# Patient Record
Sex: Female | Born: 1980 | Race: White | Hispanic: No | Marital: Married | Smoking: Never smoker
Health system: Southern US, Community
[De-identification: ages and names within clinical notes are randomized; demographics above are authoritative.]

## PROBLEM LIST (undated history)

## (undated) DIAGNOSIS — I10 Essential (primary) hypertension: Secondary | ICD-10-CM

## (undated) DIAGNOSIS — F419 Anxiety disorder, unspecified: Secondary | ICD-10-CM

## (undated) DIAGNOSIS — J302 Other seasonal allergic rhinitis: Secondary | ICD-10-CM

## (undated) DIAGNOSIS — N83201 Unspecified ovarian cyst, right side: Secondary | ICD-10-CM

## (undated) HISTORY — PX: NO PAST SURGERIES: SHX2092

---

## 2011-06-07 ENCOUNTER — Ambulatory Visit: Payer: Self-pay

## 2014-10-02 ENCOUNTER — Ambulatory Visit
Admission: EM | Admit: 2014-10-02 | Discharge: 2014-10-02 | Disposition: A | Discharge status: Home / Self Care | Payer: BLUE CROSS/BLUE SHIELD | Attending: Family Medicine | Admitting: Family Medicine

## 2014-10-02 DIAGNOSIS — Z3490 Encounter for supervision of normal pregnancy, unspecified, unspecified trimester: Secondary | ICD-10-CM

## 2014-10-02 DIAGNOSIS — B349 Viral infection, unspecified: Secondary | ICD-10-CM

## 2014-10-02 NOTE — ED Notes (Signed)
Started yesterday with cough. Progressed now to chills and bodyaches with cough. LMP 04/01/14 with [facility] 01/08/15

## 2014-10-06 ENCOUNTER — Encounter: Payer: Self-pay | Admitting: Physician Assistant

## 2014-10-06 NOTE — ED Provider Notes (Signed)
CSN: [id number]     Arrival date & time 10/02/14  1459 History   First MD Initiated Contact with Patient 10/02/14 1536     Chief Complaint  Patient presents with  . URI   (Consider location/radiation/quality/duration/timing/severity/associated sxs/prior Treatment) HPI34 yo F and husband present- has had 24 hours of mildly aching long bones, fatigue, non-productive cough.Afebrile. Currently 6 months pregnant. Has seasonal allergies -on Zyrtec. Reports she had her flu shot. Reports she has had a increased heart rate during pregnancy. Couple concerned because she napped a lot yesterday and is tired again today. She is scheduled to see OB GYN in the morning for routine. Baby has been active.  History reviewed. No pertinent past medical history. History reviewed. No pertinent past surgical history. History reviewed. No pertinent family history. History  Substance Use Topics  . Smoking status: Former Games developer  . Smokeless tobacco: Not on file  . Alcohol Use: No   OB History    Gravida Para Term Preterm AB TAB SAB Ectopic Multiple Living   3 1             Review of Systems  Constitutional: no fever. Mild fatigue Eyes: No visual changes. No red eyes/discharge. ENT:No sore throat or ear pain Cardiovascular:Negative for chest pain/palpitations, tachy 115 Respiratory: Negative for shortness of breath Gastrointestinal: No abdominal pain. No nausea,vomiting.No Diarrhea Genitourinary: Negative for dysuria.Normal urination. Musculoskeletal: Negative for back pain. FROM extremities without pain Skin: Negative for rash Neurological: Negative for headache, focal weakness or numbness   Allergies  Review of patient's allergies indicates no known allergies.  Home Medications   Prior to Admission medications   Medication Sig Start Date End Date Taking? Authorizing Provider  cetirizine (ZYRTEC) 10 MG tablet Take 10 mg by mouth daily.   Yes Historical Provider, MD  Prenatal Vit-Fe Fumarate-FA  (PRENATAL MULTIVITAMIN) TABS tablet Take 1 tablet by mouth daily at 12 noon.   Yes Historical Provider, MD   BP 123/70 mmHg  Pulse 115  Temp(Src) 98.7 F (37.1 C) (Oral)  Resp 17  Ht 5\' 8"  (1.727 m)  Wt 178 lb (80.74 kg)  BMI 27.07 kg/m2  SpO2 100%  LMP 04/01/2014 (Approximate) Physical Exam Constitutional -alert and oriented,well appearing and in no acute distress Head-atraumatic Eyes- conjunctiva normal, EOMI ,conjugate gaze Ears- canals and TM neg bilat Nose- no congestion or rhinorrhea Mouth/throat- mucous membranes moist ,oropharynx non-erythematous Neck- supple without glandular enlargement CV- tachy 115, grossly normal heart sounds, good peripheral circulation Resp-no distress, normal respiratory effort,clear to auscultation bilaterally-no cough witnessed, R 17 GI- soft,non-tender, pregnant -denies contractions, baby active GU- not examined MSK- no lower extremity tenderness nor edema,no joint effusion, ambulatory Neuro- normal speech and language,  Skin-warm,dry ,intact; no rash noted Psych-mood and affect grossly normal; speech and behavior grossly normal ED Course  Procedures (including critical care time) Labs Review Labs Reviewed - No data to display  Imaging Review No results found.  Exam is significant only for the increased HR. She exhibited no respiratory distress. Has not been particularly hungry -but is encouraged to monitor her fluid intake to be adequate for voiding every few hours. No evidence of infection and no coughing while present. We discussed viral syndrome and symptomatic interventions with tylenol if needed. Defer cold preparations and  other medication with deference to pregnancy. Rest in lateral position and increase fluids. See OB in AM  MDM   1. Viral syndrome   2. Pregnancy        Rae Halsted, PA-C  10/06/14 0707 

## 2014-11-24 ENCOUNTER — Encounter: Payer: Self-pay | Admitting: Emergency Medicine

## 2014-11-24 ENCOUNTER — Ambulatory Visit
Admission: EM | Admit: 2014-11-24 | Discharge: 2014-11-24 | Disposition: A | Discharge status: Home / Self Care | Payer: BLUE CROSS/BLUE SHIELD | Attending: Internal Medicine | Admitting: Internal Medicine

## 2014-11-24 DIAGNOSIS — Z331 Pregnant state, incidental: Secondary | ICD-10-CM

## 2014-11-24 DIAGNOSIS — W57XXXA Bitten or stung by nonvenomous insect and other nonvenomous arthropods, initial encounter: Secondary | ICD-10-CM | POA: Diagnosis not present

## 2014-11-24 DIAGNOSIS — S30861A Insect bite (nonvenomous) of abdominal wall, initial encounter: Secondary | ICD-10-CM

## 2014-11-24 DIAGNOSIS — Z3A33 33 weeks gestation of pregnancy: Secondary | ICD-10-CM

## 2014-11-24 NOTE — Discharge Instructions (Signed)
At this time, no treatment for the tick bites is indicated. Followup with [facility] if rash or fever >100.5 develop in the next 2-30 days, to discuss further evaluation/treatment.  Tick Bite Information Ticks are insects that attach themselves to the skin and draw blood for food. There are various types of ticks. Common types include wood ticks and deer ticks. Most ticks live in shrubs and grassy areas. Ticks can climb onto your body when you make contact with leaves or grass where the tick is waiting. The most common places on the body for ticks to attach themselves are the scalp, neck, armpits, waist, and groin. Most tick bites are harmless, but sometimes ticks carry germs that cause diseases. These germs can be spread to a person during the tick's feeding process. The chance of a disease spreading through a tick bite depends on:   The type of tick.  Time of year.   How long the tick is attached.   Geographic location.  HOW CAN YOU PREVENT TICK BITES? Take these steps to help prevent tick bites when you are outdoors:  Wear protective clothing. Long sleeves and long pants are best.   Wear white clothes so you can see ticks more easily.  Tuck your pant legs into your socks.   If walking on a trail, stay in the middle of the trail to avoid brushing against bushes.  Avoid walking through areas with long grass.  Put insect repellent on all exposed skin and along boot tops, pant legs, and sleeve cuffs.   Check clothing, hair, and skin repeatedly and before going inside.   Brush off any ticks that are not attached.  Take a shower or bath as soon as possible after being outdoors.  WHAT IS THE PROPER WAY TO REMOVE A TICK? Ticks should be removed as soon as possible to help prevent diseases caused by tick bites. 1. If latex gloves are available, put them on before trying to remove a tick.  2. Using fine-point tweezers, grasp the tick as close to the skin as  possible. You may also use curved forceps or a tick removal tool. Grasp the tick as close to its head as possible. Avoid grasping the tick on its body. 3. Pull gently with steady upward pressure until the tick lets go. Do not twist the tick or jerk it suddenly. This may break off the tick's head or mouth parts. 4. Do not squeeze or crush the tick's body. This could force disease-carrying fluids from the tick into your body.  5. After the tick is removed, wash the bite area and your hands with soap and water or other disinfectant such as alcohol. 6. Apply a small amount of antiseptic cream or ointment to the bite site.  7. Wash and disinfect any instruments that were used.  Do not try to remove a tick by applying a hot match, petroleum jelly, or fingernail polish to the tick. These methods do not work and may increase the chances of disease being spread from the tick bite.  WHEN SHOULD YOU SEEK MEDICAL CARE? Contact your health care provider if you are unable to remove a tick from your skin or if a part of the tick breaks off and is stuck in the skin.  After a tick bite, you need to be aware of signs and symptoms that could be related to diseases spread by ticks. Contact your health care provider if you develop any of the following in the days or weeks  after the tick bite:  Unexplained fever.  Rash. A circular rash that appears days or weeks after the tick bite may indicate the possibility of Lyme disease. The rash may resemble a target with a bull's-eye and may occur at a different part of your body than the tick bite.  Redness and swelling in the area of the tick bite.   Tender, swollen lymph glands.   Diarrhea.   Weight loss.   Cough.   Fatigue.   Muscle, joint, or bone pain.   Abdominal pain.   Headache.   Lethargy or a change in your level of consciousness.  Difficulty walking or moving your legs.   Numbness in the legs.   Paralysis.  Shortness of breath.    Confusion.   Repeated vomiting.  Document Released: 03/29/2000 Document Revised: 01/20/2013 Document Reviewed: 09/09/2012 [facility] Patient Information 2015 [place], [place]. This information is not intended to replace advice given to you by your health care provider. Make sure you discuss any questions you have with your health care provider.

## 2014-11-24 NOTE — ED Notes (Signed)
Ticks bites today 33weeks pregant

## 2014-11-24 NOTE — ED Provider Notes (Signed)
CSN: [id number]     Arrival date & time 11/24/14  1208 History   First MD Initiated Contact with Patient 11/24/14 1233     Chief Complaint  Patient presents with  . Insect Bite   HPI  Patient is a 34 year old lady who is [redacted] weeks pregnant. She presents today after discovering numerous, numerous tiny ticks on her abdomen, some embedded. She has many itchy red spots on her abdomen and lower torso. There were no ticks on her abdomen yesterday morning. No fever, no rash (other than the tick bite sites). No GI symptoms. No malaise.  History reviewed. No pertinent past medical history. History reviewed. No pertinent past surgical history.  Social History  Substance Use Topics  . Smoking status: Never Smoker   . Smokeless tobacco: None  . Alcohol Use: None   OB History    Gravida Para Term Preterm AB TAB SAB Ectopic Multiple Living   3 1             Review of Systems  All other systems reviewed and are negative.   Allergies  Review of patient's allergies indicates no known allergies.  Home Medications   Prior to Admission medications   Medication Sig Start Date End Date Taking? Authorizing Provider  cetirizine (ZYRTEC) 10 MG tablet Take 10 mg by mouth daily.    Historical Provider, MD  Prenatal Vit-Fe Fumarate-FA (PRENATAL MULTIVITAMIN) TABS tablet Take 1 tablet by mouth daily at 12 noon.    Historical Provider, MD   BP 104/62 mmHg  Pulse 91  Temp(Src) 97.8 F (36.6 C) (Oral)  Resp 18  Ht  (1.727 m)  Wt 185 lb (83.915 kg)  BMI 28.14 kg/m2  SpO2 99%  LMP 04/01/2014 (Approximate) Physical Exam  Constitutional: She is oriented to person, place, and time. No distress.  Alert, nicely groomed  HENT:  Head: Atraumatic.  Eyes:  Conjugate gaze, no eye redness/drainage  Neck: Neck supple.  Cardiovascular: Normal rate.   Pulmonary/Chest: No respiratory distress.  Abdominal: She exhibits no distension.  Gravid  Musculoskeletal: Normal range of motion.  No leg swelling   Neurological: She is alert and oriented to person, place, and time.  Skin: Skin is warm and dry.  No cyanosis Patient's abdomen and lower torso are studded with numerous, numerous small red papules, consistent with insect bites.  Nursing note and vitals reviewed.   ED Course  Procedures  Reviewed the CDC webpage regarding tick prophylaxis and the Up-To-Date literature regarding tick bite prophylaxis. Spoke with Dr. Alvino Chapel at [facility].  MDM   1. Tick bite of abdomen, initial encounter   2. [redacted] weeks gestation of pregnancy    No prophylaxis indicated: pt is pregnant, multiple bites but discovered within 24 hrs, bitten in nonendemic state ([place]). Discussed with Dr G[facility] Grams OB/Gyn who concurred.  Pt to followup with OB if she develops fever/rash within the next 30 days, to discuss further evaluation/management.    Eustace Moore, MD 11/24/14 2127

## 2016-06-05 ENCOUNTER — Encounter: Payer: Self-pay | Admitting: *Deleted

## 2016-06-05 ENCOUNTER — Ambulatory Visit (INDEPENDENT_AMBULATORY_CARE_PROVIDER_SITE_OTHER): Payer: BLUE CROSS/BLUE SHIELD

## 2016-06-05 ENCOUNTER — Ambulatory Visit
Admission: EM | Admit: 2016-06-05 | Discharge: 2016-06-05 | Disposition: A | Discharge status: Home / Self Care | Payer: BLUE CROSS/BLUE SHIELD | Attending: Family Medicine | Admitting: Family Medicine

## 2016-06-05 DIAGNOSIS — S8012XA Contusion of left lower leg, initial encounter: Secondary | ICD-10-CM

## 2016-06-05 DIAGNOSIS — S9032XA Contusion of left foot, initial encounter: Secondary | ICD-10-CM | POA: Diagnosis not present

## 2016-06-05 DIAGNOSIS — M84475A Pathological fracture, left foot, initial encounter for fracture: Secondary | ICD-10-CM

## 2016-06-05 MED ORDER — HYDROCODONE-ACETAMINOPHEN 5-325 MG PO TABS: 1.0000 | ORAL_TABLET | Freq: Three times a day (TID) | ORAL | 0 refills | Status: DC | PRN

## 2016-06-05 NOTE — ED Triage Notes (Signed)
Pt tripped on ladder last night, now c/o left foot pain across top of left foot.

## 2016-06-05 NOTE — ED Provider Notes (Signed)
[facility]    CSN: [id number] Arrival date & time: 06/05/16  0841     History   Chief Complaint Chief Complaint  Patient presents with  . Foot Injury    HPI Carolyn Ward is a 36 y.o. female.   Patient is a 36 year old white female basically fell off ladder MR doing this hyperextended her left foot as she was following. She also received improved contusion to the left upper leg as well. There is no loss consciousness this happened last night at home. She reports inability to walk on the left foot now because the pain. Past medical history she does not smoke no previous surgeries or operations active medical problems. She is dying top of chronic medication other than Zantac for reflux. She denies being pregnant and she does not smoke. No pertinent family medical history relevant today's visit.   The history is provided by the patient and the spouse. No language interpreter was used.  Foot Injury  Location:  Leg and foot Injury: yes   Leg location:  L leg and L lower leg Pain details:    Quality:  Aching, pressure, sharp and throbbing   Radiates to:  Does not radiate   Severity:  Moderate   Onset quality:  Sudden   Timing:  Constant   Progression:  Worsening Chronicity:  New Dislocation: no   Foreign body present:  No foreign bodies   History reviewed. No pertinent past medical history.  There are no active problems to display for this patient.   History reviewed. No pertinent surgical history.  OB History    Gravida Para Term Preterm AB Living   3 1           SAB TAB Ectopic Multiple Live Births                   Home Medications    Prior to Admission medications   Medication Sig Start Date End Date Taking? Authorizing Provider  cetirizine (ZYRTEC) 10 MG tablet Take 10 mg by mouth daily.   Yes Historical Provider, MD  sertraline (ZOLOFT) 50 MG tablet Take 50 mg by mouth daily.   Yes Historical Provider, MD  HYDROcodone-acetaminophen (NORCO)  5-325 MG tablet Take 1 tablet by mouth every 8 (eight) hours as needed for moderate pain. 06/05/16   Hassan Rowan, MD  Prenatal Vit-Fe Fumarate-FA (PRENATAL MULTIVITAMIN) TABS tablet Take 1 tablet by mouth daily at 12 noon.    Historical Provider, MD    Family History History reviewed. No pertinent family history.  Social History Social History  Substance Use Topics  . Smoking status: Never Smoker  . Smokeless tobacco: Never Used  . Alcohol use Yes     Allergies   Patient has no known allergies.   Review of Systems Review of Systems  Musculoskeletal: Positive for gait problem and myalgias.     Physical Exam Triage Vital Signs ED Triage Vitals  Enc Vitals Group     BP 06/05/16 0906 121/81     Pulse Rate 06/05/16 0906 74     Resp 06/05/16 0906 16     Temp 06/05/16 0906 98.7 F (37.1 C)     Temp Source 06/05/16 0906 Oral     SpO2 06/05/16 0906 99 %     Weight 06/05/16 0908 176 lb (79.8 kg)     Height 06/05/16 0908 5\' 8"  (1.727 m)     Head Circumference --      Peak Flow --  Pain Score --      Pain Loc --      Pain Edu? --      Excl. in GC? --    No data found.   Updated Vital Signs BP 121/81 (BP Location: Left Arm)   Pulse 74   Temp 98.7 F (37.1 C) (Oral)   Resp 16   Ht 5\' 8"  (1.727 m)   Wt 176 lb (79.8 kg)   LMP 05/22/2016 (Exact Date) Comment: denies preg, has IUD  SpO2 99%   Breastfeeding? Unknown   BMI 26.76 kg/m   Visual Acuity Right Eye Distance:   Left Eye Distance:   Bilateral Distance:    Right Eye Near:   Left Eye Near:    Bilateral Near:     Physical Exam  Constitutional: She is oriented to person, place, and time. She appears well-developed and well-nourished.  HENT:  Head: Normocephalic and atraumatic.  Eyes: Pupils are equal, round, and reactive to light.  Neck: Normal range of motion.  Pulmonary/Chest: Effort normal.  Musculoskeletal: She exhibits edema and tenderness.       Left lower leg: She exhibits tenderness and  swelling.       Legs:      Left foot: There is tenderness and bony tenderness.       Feet:  Swelling and ecchymosis of the left distal foot. Over the proximal left lower leg inner aspect of the leg there is a significant bruise present  Neurological: She is alert and oriented to person, place, and time.  Skin: Skin is warm.  Psychiatric: She has a normal mood and affect.  Vitals reviewed.    UC Treatments / Results  Labs (all labs ordered are listed, but only abnormal results are displayed) Labs Reviewed - No data to display  EKG  EKG Interpretation None       Radiology Dg Tibia/fibula Left  Result Date: 06/05/2016 CLINICAL DATA:  Larey SeatFell off ladder injuring the left lower leg and foot EXAM: LEFT TIBIA AND FIBULA - 2 VIEW COMPARISON:  None. FINDINGS: The left tibia and fibula are unremarkable. No fracture is seen. Alignment is normal. What is seen of the left knee joint and left ankle joint appears normal. IMPRESSION: Negative. Electronically Signed   By: Dwyane DeePaul  Barry M.D.   On: 06/05/2016 10:00   Dg Foot Complete Left  Result Date: 06/05/2016 CLINICAL DATA:  Larey SeatFell 4 foot from ladder last p.m., left foot injury, left lower leg pain EXAM: LEFT FOOT - COMPLETE 3+ VIEW COMPARISON:  None. FINDINGS: Three views of the left foot submitted. There is minimal displaced oblique fracture distal aspect of third metatarsal. IMPRESSION: Minimal displaced oblique fracture distal aspect of left first metatarsal. Electronically Signed   By: Natasha MeadLiviu  Pop M.D.   On: 06/05/2016 10:00    Procedures Procedures (including critical care time)  Medications Ordered in UC Medications - No data to display   Initial Impression / Assessment and Plan / UC Course  I have reviewed the triage vital signs and the nursing notes.  Pertinent labs & imaging results that were available during my care of the patient were reviewed by me and considered in my medical decision making (see chart for details).   patient  initially declined Toradol injection for pain since only hurts when she walks. Extradition confirm a fracture of the distal third metatarsal bone of the left foot. Since his some mild displacement present. Discussed with patient husband offered podiatry referral to Dr. Ether GriffinsFowler but they're  going to look see who they want to see. We'll place on cam boot for the time being and follow-up with the podiatrist choice next week. Patient was looked up in the [facility] drug reporting the site and no signs of use of medication in the last year to narcotics or other controlled substances and will give a prescription for Vicodin No. 15    Final Clinical Impressions(s) / UC Diagnoses   Final diagnoses:  Contusion of left foot, initial encounter  Metatarsal fracture, pathologic, left, initial encounter  Contusion of left lower leg, initial encounter    New Prescriptions New Prescriptions   HYDROCODONE-ACETAMINOPHEN (NORCO) 5-325 MG TABLET    Take 1 tablet by mouth every 8 (eight) hours as needed for moderate pain.    Note: This dictation was prepared with Dragon dictation along with smaller phrase technology. Any transcriptional errors that result from this process are unintentional.   Hassan Rowan, MD 06/05/16 1103

## 2017-12-26 ENCOUNTER — Encounter: Payer: Self-pay | Admitting: Emergency Medicine

## 2017-12-26 ENCOUNTER — Other Ambulatory Visit: Payer: Self-pay

## 2017-12-26 ENCOUNTER — Ambulatory Visit
Admission: EM | Admit: 2017-12-26 | Discharge: 2017-12-26 | Disposition: A | Discharge status: Home / Self Care | Payer: 59 | Attending: Family Medicine | Admitting: Family Medicine

## 2017-12-26 DIAGNOSIS — R21 Rash and other nonspecific skin eruption: Secondary | ICD-10-CM

## 2017-12-26 HISTORY — DX: Other seasonal allergic rhinitis: J30.2

## 2017-12-26 HISTORY — DX: Anxiety disorder, unspecified: F41.9

## 2017-12-26 MED ORDER — HYDROXYZINE HCL 25 MG PO TABS: 25.0000 mg | ORAL_TABLET | Freq: Three times a day (TID) | ORAL | 0 refills | Status: DC | PRN

## 2017-12-26 MED ORDER — PREDNISONE 10 MG (21) PO TBPK: ORAL_TABLET | ORAL | 0 refills | Status: DC

## 2017-12-26 NOTE — Discharge Instructions (Signed)
Meds as prescribed. ° °Take care ° °Dr. Giancarlo Askren  °

## 2017-12-26 NOTE — ED Triage Notes (Signed)
Patient in today c/o rash x 2 days all over her body. Patient states the rash is prickly, but not painful. She states her children had the rash and well and pediatrician states it was viral.

## 2017-12-26 NOTE — ED Provider Notes (Signed)
[facility]  CSN: [id number] Arrival date & time: 12/26/17  1130  History   Chief Complaint Chief Complaint  Patient presents with  . Rash    HPI  37 year old female presents with rash.  2-day history of diffuse rash.  Associated itching and erythema.  No pain.  Her children have been sick and have had a viral rashes.  They have now improved.  No reported new contacts or exposures other than her children being sick.  No medications or interventions tried.  No known exacerbating factors.  No other complaints.  PMH, Surgical Hx, Family Hx, Social History reviewed and updated as below.  Past Medical History:  Diagnosis Date  . Anxiety   . Seasonal allergies    History reviewed. No pertinent surgical history.  OB History    Gravida  3   Para  1   Term      Preterm      AB      Living        SAB      TAB      Ectopic      Multiple      Live Births             Home Medications    Prior to Admission medications   Medication Sig Start Date End Date Taking? Authorizing Provider  cetirizine (ZYRTEC) 10 MG tablet Take 10 mg by mouth daily.   Yes [provider]  sertraline (ZOLOFT) 50 MG tablet Take 50 mg by mouth daily.   Yes [provider]  hydrOXYzine (ATARAX/VISTARIL) 25 MG tablet Take 1 tablet (25 mg total) by mouth every 8 (eight) hours as needed. 12/26/17   Tommie Sams, DO  predniSONE (STERAPRED UNI-PAK 21 TAB) 10 MG (21) TBPK tablet 6 tablets on day 1; decrease by 1 tablet daily until gone. 12/26/17   Tommie Sams, DO   Family History Family History  Problem Relation Age of Onset  . Hypertension Mother   . Other Father        unknown medical history   Social History Social History   Tobacco Use  . Smoking status: Never Smoker  . Smokeless tobacco: Never Used  Substance Use Topics  . Alcohol use: Yes    Comment: socially  . Drug use: Never    Allergies   Patient has no known allergies.   Review of  Systems Review of Systems  Constitutional: Negative.   Skin: Positive for rash.   Physical Exam Triage Vital Signs ED Triage Vitals  Enc Vitals Group     BP 12/26/17 1140 129/84     Pulse Rate 12/26/17 1140 75     Resp 12/26/17 1140 16     Temp 12/26/17 1140 98 F (36.7 C)     Temp Source 12/26/17 1140 Oral     SpO2 12/26/17 1140 100 %     Weight 12/26/17 1139 195 lb (88.5 kg)     Height 12/26/17 1139 5\' 8"  (1.727 m)     Head Circumference --      Peak Flow --      Pain Score 12/26/17 1139 0     Pain Loc --      Pain Edu? --      Excl. in GC? --    Updated Vital Signs BP 129/84 (BP Location: Left Arm)   Pulse 75   Temp 98 F (36.7 C) (Oral)   Resp 16   Ht 5\' 8"  (  1.727 m)   Wt 88.5 kg   SpO2 100%   BMI 29.65 kg/m   Visual Acuity Right Eye Distance:   Left Eye Distance:   Bilateral Distance:    Right Eye Near:   Left Eye Near:    Bilateral Near:     Physical Exam  Constitutional: She is oriented to person, place, and time. She appears well-developed. No distress.  HENT:  Head: Normocephalic and atraumatic.  Cardiovascular: Normal rate and regular rhythm.  Pulmonary/Chest: Effort normal. No respiratory distress.  Neurological: She is alert and oriented to person, place, and time.  Skin:  Diffuse erythematous flat rash.  Psychiatric: She has a normal mood and affect. Her behavior is normal.  Nursing note and vitals reviewed.  UC Treatments / Results  Labs (all labs ordered are listed, but only abnormal results are displayed) Labs Reviewed - No data to display  EKG None  Radiology No results found.  Procedures Procedures (including critical care time)  Medications Ordered in UC Medications - No data to display  Initial Impression / Assessment and Plan / UC Course  I have reviewed the triage vital signs and the nursing notes.  Pertinent labs & imaging results that were available during my care of the patient were reviewed by me and considered in  my medical decision making (see chart for details).    37 year old female presents with rash.  Treating empirically with prednisone and Atarax.  Supportive care.  Final Clinical Impressions(s) / UC Diagnoses   Final diagnoses:  Rash     Discharge Instructions     Meds as prescribed.  Take care  Dr. Adriana Simasook    ED Prescriptions    Medication Sig Dispense Auth. Provider   predniSONE (STERAPRED UNI-PAK 21 TAB) 10 MG (21) TBPK tablet 6 tablets on day 1; decrease by 1 tablet daily until gone. 21 tablet Madelyne Millikan G, DO   hydrOXYzine (ATARAX/VISTARIL) 25 MG tablet Take 1 tablet (25 mg total) by mouth every 8 (eight) hours as needed. 30 tablet Tommie Samsook, Summerlyn Fickel G, DO     Controlled Substance Prescriptions [place] Controlled Substance Registry consulted? Not Applicable   Tommie SamsCook, Tarra Pence G, DO 12/26/17 1250

## 2018-02-16 ENCOUNTER — Other Ambulatory Visit: Payer: Self-pay

## 2018-02-16 ENCOUNTER — Ambulatory Visit
Admission: EM | Admit: 2018-02-16 | Discharge: 2018-02-16 | Disposition: A | Discharge status: Home / Self Care | Payer: 59 | Attending: Family Medicine | Admitting: Family Medicine

## 2018-02-16 DIAGNOSIS — B9789 Other viral agents as the cause of diseases classified elsewhere: Secondary | ICD-10-CM

## 2018-02-16 DIAGNOSIS — J988 Other specified respiratory disorders: Secondary | ICD-10-CM | POA: Diagnosis not present

## 2018-02-16 MED ORDER — BENZONATATE 100 MG PO CAPS: 100.0000 mg | ORAL_CAPSULE | Freq: Three times a day (TID) | ORAL | 0 refills | Status: DC | PRN

## 2018-02-16 MED ORDER — PREDNISONE 50 MG PO TABS: ORAL_TABLET | ORAL | 0 refills | Status: DC

## 2018-02-16 NOTE — ED Provider Notes (Signed)
[facility]    CSN: [id number] Arrival date & time: 02/16/18  1038  History   Chief Complaint Chief Complaint  Patient presents with  . Cough   HPI  37 year old female presents with cough.  Children have been sick as well.  She reports cough, congestion, body aches, and chest tightness for the past week.  No fever.  No chills.  No known exacerbating or relieving factors.  Symptoms are moderate in severity.  No other associated symptoms.  No other complaints.  PMH, Surgical Hx, Family Hx, Social History reviewed and updated as below.  Past Medical History:  Diagnosis Date  . Anxiety   . Seasonal allergies    Past Surgical History:  Procedure Laterality Date  . NO PAST SURGERIES     OB History    Gravida  3   Para  1   Term      Preterm      AB      Living        SAB      TAB      Ectopic      Multiple      Live Births             Home Medications    Prior to Admission medications   Medication Sig Start Date End Date Taking? Authorizing Provider  cetirizine (ZYRTEC) 10 MG tablet Take 10 mg by mouth daily.   Yes [provider]  montelukast (SINGULAIR) 10 MG tablet  01/24/18  Yes [provider]  sertraline (ZOLOFT) 50 MG tablet Take 50 mg by mouth daily.   Yes [provider]  benzonatate (TESSALON) 100 MG capsule Take 1 capsule (100 mg total) by mouth 3 (three) times daily as needed. 02/16/18   Tommie Sams, DO  predniSONE (DELTASONE) 50 MG tablet 1 tablet daily x 5 days 02/16/18   Tommie Sams, DO   Family History Family History  Problem Relation Age of Onset  . Hypertension Mother   . Other Father        unknown medical history   Social History Social History   Tobacco Use  . Smoking status: Never Smoker  . Smokeless tobacco: Never Used  Substance Use Topics  . Alcohol use: Yes    Comment: socially  . Drug use: Never   Allergies   Patient has no known allergies.  Review of Systems Review  of Systems  Constitutional: Negative for fever.  Respiratory: Positive for cough and chest tightness.   Musculoskeletal:       Body aches.   Physical Exam Triage Vital Signs ED Triage Vitals [02/16/18 1057]  Enc Vitals Group     BP (!) 146/90     Pulse Rate 98     Resp 18     Temp 98.5 F (36.9 C)     Temp Source Oral     SpO2 97 %     Weight 198 lb (89.8 kg)     Height 5\' 8"  (1.727 m)     Head Circumference      Peak Flow      Pain Score 4     Pain Loc      Pain Edu?      Excl. in GC?    No data found.  Updated Vital Signs BP (!) 146/90 (BP Location: Left Arm)   Pulse 98   Temp 98.5 F (36.9 C) (Oral)   Resp 18   Ht 5\' 8"  (  1.727 m)   Wt 89.8 kg   SpO2 97%   Breastfeeding? No   BMI 30.11 kg/m   Visual Acuity Right Eye Distance:   Left Eye Distance:   Bilateral Distance:    Right Eye Near:   Left Eye Near:    Bilateral Near:     Physical Exam  Constitutional: She is oriented to person, place, and time. She appears well-developed. No distress.  HENT:  Head: Normocephalic and atraumatic.  Mouth/Throat: Oropharynx is clear and moist.  Cardiovascular: Normal rate and regular rhythm.  Pulmonary/Chest: Effort normal and breath sounds normal. She has no wheezes. She has no rales.  Neurological: She is alert and oriented to person, place, and time.  Psychiatric: She has a normal mood and affect. Her behavior is normal.  Nursing note and vitals reviewed.  UC Treatments / Results  Labs (all labs ordered are listed, but only abnormal results are displayed) Labs Reviewed - No data to display  EKG None  Radiology No results found.  Procedures Procedures (including critical care time)  Medications Ordered in UC Medications - No data to display  Initial Impression / Assessment and Plan / UC Course  I have reviewed the triage vital signs and the nursing notes.  Pertinent labs & imaging results that were available during my care of the patient were  reviewed by me and considered in my medical decision making (see chart for details).    37 year old female presents with a viral respiratory infection.  Treating with Tessalon Perles and short course of prednisone.  Final Clinical Impressions(s) / UC Diagnoses   Final diagnoses:  Viral respiratory infection     Discharge Instructions     Rest, fluids.  Meds as prescribed.  Take care  Dr. Adriana Simas    ED Prescriptions    Medication Sig Dispense Auth. Provider   benzonatate (TESSALON) 100 MG capsule Take 1 capsule (100 mg total) by mouth 3 (three) times daily as needed. 30 capsule Geza Beranek G, DO   predniSONE (DELTASONE) 50 MG tablet 1 tablet daily x 5 days 5 tablet Tommie Sams, DO     Controlled Substance Prescriptions [place] Controlled Substance Registry consulted? Not Applicable   Tommie Sams, DO 02/16/18 1146

## 2018-02-16 NOTE — ED Triage Notes (Signed)
Patient complains of cough, congestion, body aches and chest tightness x 1 week.

## 2018-02-16 NOTE — Discharge Instructions (Signed)
Rest, fluids. ° °Meds as prescribed. ° °Take care ° °Dr. Jacyln Carmer  °

## 2020-05-07 ENCOUNTER — Encounter: Payer: Self-pay | Admitting: Emergency Medicine

## 2020-05-07 ENCOUNTER — Other Ambulatory Visit: Payer: Self-pay

## 2020-05-07 ENCOUNTER — Ambulatory Visit
Admission: EM | Admit: 2020-05-07 | Discharge: 2020-05-07 | Disposition: A | Discharge status: Home / Self Care | Payer: 59 | Attending: Orthopedic Surgery | Admitting: Orthopedic Surgery

## 2020-05-07 DIAGNOSIS — J40 Bronchitis, not specified as acute or chronic: Secondary | ICD-10-CM

## 2020-05-07 DIAGNOSIS — H9201 Otalgia, right ear: Secondary | ICD-10-CM | POA: Diagnosis not present

## 2020-05-07 DIAGNOSIS — J321 Chronic frontal sinusitis: Secondary | ICD-10-CM | POA: Diagnosis not present

## 2020-05-07 MED ORDER — BENZONATATE 100 MG PO CAPS: 100.0000 mg | ORAL_CAPSULE | Freq: Three times a day (TID) | ORAL | 0 refills | Status: DC

## 2020-05-07 MED ORDER — ALBUTEROL SULFATE HFA 108 (90 BASE) MCG/ACT IN AERS: 1.0000 | INHALATION_SPRAY | Freq: Four times a day (QID) | RESPIRATORY_TRACT | 0 refills | Status: AC | PRN

## 2020-05-07 MED ORDER — DOXYCYCLINE HYCLATE 100 MG PO CAPS: 100.0000 mg | ORAL_CAPSULE | Freq: Two times a day (BID) | ORAL | 0 refills | Status: DC

## 2020-05-07 MED ORDER — PREDNISONE 20 MG PO TABS: 40.0000 mg | ORAL_TABLET | Freq: Every day | ORAL | 0 refills | Status: AC

## 2020-05-07 MED ORDER — CLARITIN-D 12 HOUR 5-120 MG PO TB12: 1.0000 | ORAL_TABLET | Freq: Two times a day (BID) | ORAL | 0 refills | Status: DC

## 2020-05-07 NOTE — ED Provider Notes (Signed)
[facility]    CSN: [id number] Arrival date & time: 05/07/20  1052      History   Chief Complaint Chief Complaint  Patient presents with  . Sinus Problem  . Cough    HPI Carolyn Ward is a 40 y.o. female presents to the emergency department for evaluation of sinus pain, dry cough, chest tightness x2 weeks.  She is also complaining of right greater than left ear fullness.  Patient had COVID test that was -1-week ago.  Patient with more runny nose nasal congestion 2 weeks ago now more of a dry nagging cough with pressure in both ears and frontal sinus pain.  She denies any fevers or productive cough.  No chest pain or shortness of breath but does have some tightness in thinks she may have a little bit of wheezing.  She does not smoke or vape.  No history of breathing issues.  She has been taking some Zyrtec daily.  HPI  Past Medical History:  Diagnosis Date  . Anxiety   . Seasonal allergies     There are no problems to display for this patient.   Past Surgical History:  Procedure Laterality Date  . NO PAST SURGERIES      OB History    Gravida  3   Para  1   Term      Preterm      AB      Living        SAB      IAB      Ectopic      Multiple      Live Births               Home Medications    Prior to Admission medications   Medication Sig Start Date End Date Taking? Authorizing Provider  albuterol (VENTOLIN HFA) 108 (90 Base) MCG/ACT inhaler Inhale 1-2 puffs into the lungs every 6 (six) hours as needed for wheezing or shortness of breath. 05/07/20  Yes Evon Slack, PA-C  benzonatate (TESSALON) 100 MG capsule Take 1 capsule (100 mg total) by mouth every 8 (eight) hours. 05/07/20  Yes Evon Slack, PA-C  doxycycline (VIBRAMYCIN) 100 MG capsule Take 1 capsule (100 mg total) by mouth 2 (two) times daily. 05/07/20  Yes Evon Slack, PA-C  loratadine-pseudoephedrine (CLARITIN-D 12 HOUR) 5-120 MG tablet Take 1 tablet by mouth 2  (two) times daily. 05/07/20  Yes Evon Slack, PA-C  montelukast (SINGULAIR) 10 MG tablet  01/24/18  Yes [provider]  predniSONE (DELTASONE) 20 MG tablet Take 2 tablets (40 mg total) by mouth daily for 5 days. 05/07/20 05/12/20 Yes Evon Slack, PA-C  sertraline (ZOLOFT) 50 MG tablet Take 50 mg by mouth daily.   Yes [provider]  cetirizine (ZYRTEC) 10 MG tablet Take 10 mg by mouth daily.  05/07/20 Yes [provider]    Family History Family History  Problem Relation Age of Onset  . Hypertension Mother   . Other Father        unknown medical history    Social History Social History   Tobacco Use  . Smoking status: Never Smoker  . Smokeless tobacco: Never Used  Vaping Use  . Vaping Use: Never used  Substance Use Topics  . Alcohol use: Yes    Comment: socially  . Drug use: Never     Allergies   Patient has no known allergies.   Review of Systems Review  of Systems  Constitutional: Negative for chills and fever.  HENT: Positive for congestion, ear pain, sinus pressure and sinus pain. Negative for ear discharge, rhinorrhea, sore throat, trouble swallowing and voice change.   Respiratory: Positive for cough, chest tightness and wheezing. Negative for shortness of breath and stridor.   Cardiovascular: Negative for chest pain.  Gastrointestinal: Negative for abdominal pain, diarrhea, nausea and vomiting.  Genitourinary: Negative for dysuria, flank pain and pelvic pain.  Musculoskeletal: Negative for back pain and myalgias.  Skin: Negative for rash.  Neurological: Negative for dizziness and headaches.     Physical Exam Triage Vital Signs ED Triage Vitals  Enc Vitals Group     BP 05/07/20 1105 137/85     Pulse Rate 05/07/20 1105 72     Resp 05/07/20 1105 14     Temp 05/07/20 1105 98.2 F (36.8 C)     Temp Source 05/07/20 1105 Oral     SpO2 05/07/20 1105 97 %     Weight 05/07/20 1102 199 lb (90.3 kg)     Height 05/07/20 1102 5'  6" (1.676 m)     Head Circumference --      Peak Flow --      Pain Score 05/07/20 1102 3     Pain Loc --      Pain Edu? --      Excl. in GC? --    No data found.  Updated Vital Signs BP 137/85 (BP Location: Right Arm)   Pulse 72   Temp 98.2 F (36.8 C) (Oral)   Resp 14   Ht 5\' 6"  (1.676 m)   Wt 199 lb (90.3 kg)   SpO2 97%   BMI 32.12 kg/m   Visual Acuity Right Eye Distance:   Left Eye Distance:   Bilateral Distance:    Right Eye Near:   Left Eye Near:    Bilateral Near:     Physical Exam Constitutional:      Appearance: She is well-developed and well-nourished.  HENT:     Head: Normocephalic and atraumatic.     Right Ear: Tympanic membrane, ear canal and external ear normal.     Left Ear: Tympanic membrane, ear canal and external ear normal.     Ears:     Comments: Clear fluid present behind the TMs bilaterally.  No signs of infection or rupture.    Mouth/Throat:     Mouth: Mucous membranes are moist.     Pharynx: No oropharyngeal exudate or posterior oropharyngeal erythema.  Eyes:     Extraocular Movements: Extraocular movements intact.     Conjunctiva/sclera: Conjunctivae normal.  Cardiovascular:     Rate and Rhythm: Normal rate.     Pulses: Normal pulses.     Heart sounds: Normal heart sounds.  Pulmonary:     Effort: Pulmonary effort is normal. No respiratory distress.     Breath sounds: No wheezing or rales.     Comments: Breath sounds are clear but she seems to be tight, unable to take a deep breath. Chest:     Chest wall: No tenderness.  Musculoskeletal:        General: Normal range of motion.     Cervical back: Normal range of motion.  Skin:    General: Skin is warm.     Findings: No rash.  Neurological:     Mental Status: She is alert and oriented to person, place, and time.  Psychiatric:        Mood and Affect:  Mood and affect normal.        Behavior: Behavior normal.        Thought Content: Thought content normal.      UC Treatments /  Results  Labs (all labs ordered are listed, but only abnormal results are displayed) Labs Reviewed - No data to display  EKG   Radiology No results found.  Procedures Procedures (including critical care time)  Medications Ordered in UC Medications - No data to display  Initial Impression / Assessment and Plan / UC Course  I have reviewed the triage vital signs and the nursing notes.  Pertinent labs & imaging results that were available during my care of the patient were reviewed by me and considered in my medical decision making (see chart for details).     40 year old female with history and exam findings consistent with sinusitis.  She has a dry nonproductive cough, afebrile.  Complains of frontal sinus pain with ear pressure.  She is started on doxycycline, Claritin-D, albuterol, prednisone and Tessalon Perls.  She understands signs and symptoms to return to the urgent care for. Final Clinical Impressions(s) / UC Diagnoses   Final diagnoses:  Bronchitis  Chronic frontal sinusitis  Right ear pain     Discharge Instructions     Please take medications as prescribed.  If any shortness of breath, fevers, productive cough return to the urgent care.   ED Prescriptions    Medication Sig Dispense Auth. Provider   doxycycline (VIBRAMYCIN) 100 MG capsule Take 1 capsule (100 mg total) by mouth 2 (two) times daily. 20 capsule Evon Slack, PA-C   loratadine-pseudoephedrine (CLARITIN-D 12 HOUR) 5-120 MG tablet Take 1 tablet by mouth 2 (two) times daily. 20 tablet Evon Slack, PA-C   albuterol (VENTOLIN HFA) 108 (90 Base) MCG/ACT inhaler Inhale 1-2 puffs into the lungs every 6 (six) hours as needed for wheezing or shortness of breath. 1 each Evon Slack, PA-C   predniSONE (DELTASONE) 20 MG tablet Take 2 tablets (40 mg total) by mouth daily for 5 days. 10 tablet Evon Slack, PA-C   benzonatate (TESSALON) 100 MG capsule Take 1 capsule (100 mg total) by mouth every 8  (eight) hours. 21 capsule Evon Slack, PA-C     PDMP not reviewed this encounter.   Evon Slack, [place] 05/07/20 1138

## 2020-05-07 NOTE — ED Triage Notes (Signed)
Patient c/o sinus congestion and pressure, HAs, ear fullness, and cough for the past 2 weeks.  Patient denies fevers.  Patient had covid test on a week ago and was negative.

## 2020-05-07 NOTE — Discharge Instructions (Addendum)
Please take medications as prescribed.  If any shortness of breath, fevers, productive cough return to the urgent care.

## 2021-07-02 ENCOUNTER — Other Ambulatory Visit: Payer: Self-pay | Admitting: Obstetrics and Gynecology

## 2021-08-03 NOTE — H&P (Signed)
Preoperative History and Physical ? ?Carolyn Ward is a 41 y.o. [id number] here for surgical management of Chronic Pelvic Pain.   No significant preoperative concerns. ? ?History of Present Illness: 41 y.o. [id number] female who presents for a pre-operative visit. She initially presented in follow-up from an ER visit on February 20 of this year for severe pelvic pain.  She states that she was seen in October and had a 4 cm cyst on her right side ovary at that time.  She was noted to have a left-sided simple cyst measuring 4.2 cm and a right-sided simple ovarian cyst measuring 1.3 cm. ?  ?She presented to the emergency room on 05/01/2021 and underwent a pelvic ultrasound that showed a normal-appearing right and left ovary with normal vascular flow.  Her IUD was noted to be in the correct position.  She returned to the emergency room on 06/04/2021 and underwent another ultrasound that showed a normal left ovary.  Her right ovary had a 2.1 x 1.7 x 1.6 cm simple appearing cyst/follicle, and an additional follicle/cyst on the that measured 1 x 0.8 x 1.1 cm.  The ultrasound was otherwise unremarkable. ?  ?She has had negative STI screening, normal CBC and CMP, negative pregnancy test.  Her last Pap smear was 05/21/2019 and was normal. ?  ?Her right lower quadrant pain started in October abruptly.  She describes it as a gnawing, constant pain in her right lower quadrant that radiates to her back.  The pain comes and goes.  She describes the pain as severe.  Aggravating factors: Intercourse, pelvic exams, sometimes voiding and bowel movements.  Alleviating factors: Ibuprofen helps a little.  Associated symptoms: Heavier periods.  She has had her ParaGard IUD for 2 years and has had no problems with the device so far.  She has no personal history of endometriosis.  She knows that her maternal grandmother and maternal aunt both had hysterectomies all in their 11s.  However she does not know the reason.  Her menses come  regularly. ? ?Proposed surgery: Robot assisted total laparoscopic hysterectomy, bilateral salpingectomy, cystoscopy  ? ?Past Medical History:  ?Diagnosis Date  ? Anxiety   ? Closed displaced fracture of third metatarsal bone of left foot 06/05/2016  ? Hypoglycemia   ? Ovarian cyst   ? R side  ? ?Past Surgical History:  ?Procedure Laterality Date  ? TONSILLECTOMY    ? wisdom teeth    ? ?OB History  ?Gravida Para Term Preterm AB Living  ?2 2 2  0 0 2  ?SAB IAB Ectopic Molar Multiple Live Births  ?0 0 0   0 2  ?  ?# Outcome Date GA Lbr Len/2nd Weight Sex Delivery Anes PTL Lv  ?2 Term 01/10/15 [redacted]w[redacted]d   M Vag-Spont   LIV  ?1 Term 05/13/12 [redacted]w[redacted]d  3.771 kg (8 lb 5 oz) M Vag-Spont  N LIV  ?Patient denies any other pertinent gynecologic issues.  ? ?Current Outpatient Medications on File Prior to Visit  ?Medication Sig Dispense Refill  ? CETIRIZINE HCL (ZYRTEC ORAL) Take by mouth once daily.    ? copper intrauterine contraceptive (PARAGARD T 380A) IUD Insert 1 each into the uterus once    ? losartan (COZAAR) 50 MG tablet Take 1 tablet (50 mg total) by mouth once daily 30 tablet 11  ? sertraline (ZOLOFT) 50 MG tablet Take 1 tablet (50 mg total) by mouth once daily 90 tablet 3  ? ?No current facility-administered medications on file prior to visit.  ? ?  No Known Allergies ? ?Social History:   reports that she quit smoking about 9 years ago. Her smoking use included cigarettes. She has a 2.50 pack-year smoking history. She has never used smokeless tobacco. She reports current alcohol use. She reports that she does not use drugs. ? ?Family History  ?Problem Relation Age of Onset  ? High blood pressure (Hypertension) Mother   ? Coronary Artery Disease (Blocked arteries around heart) Maternal Grandmother 55  ? Lung cancer Maternal Grandfather   ? Depression Paternal Grandmother   ? Alcohol abuse Paternal Grandfather   ? Cirrhosis Paternal Grandfather   ? ? ?Review of Systems: Noncontributory ? ?PHYSICAL EXAM: ?Blood pressure 118/72,  height 172.7 cm (5' 7.99"), weight 93.3 kg (205 lb 9.6 oz). ?CONSTITUTIONAL: Well-developed, well-nourished female in no acute distress.  ?HENT:  Normocephalic, atraumatic, External right and left ear normal. Oropharynx is clear and moist ?EYES: Conjunctivae and EOM are normal. Pupils are equal, round, and reactive to light. No scleral icterus.  ?NECK: Normal range of motion, supple, no masses ?SKIN: Skin is warm and dry. No rash noted. Not diaphoretic. No erythema. No pallor. ?NEUROLGIC: Alert and oriented to person, place, and time. Normal reflexes, muscle tone coordination. No cranial nerve deficit noted. ?PSYCHIATRIC: Normal mood and affect. Normal behavior. Normal judgment and thought content. ?CARDIOVASCULAR: Normal heart rate noted, regular rhythm ?RESPIRATORY: Effort and breath sounds normal, no problems with respiration noted ?ABDOMEN: Soft, nontender, nondistended. ?PELVIC: Deferred ?MUSCULOSKELETAL: Normal range of motion. No edema and no tenderness. 2+ distal pulses. ? ?Labs: ?No results found for this or any previous visit (from the past 336 hour(s)). ? ?Imaging Studies: ?No results found. ? ?Assessment: ?1. Pelvic pain in female   ?2. Menorrhagia with regular cycle   ?  ?Plan: ?Patient will undergo surgical management with the above surgery.   The risks of surgery were discussed in detail with the patient including but not limited to: bleeding which may require transfusion or reoperation; infection which may require antibiotics; injury to surrounding organs which may involve bowel, bladder, ureters ; need for additional procedures including laparoscopy or laparotomy; thromboembolic phenomenon, surgical site problems and other postoperative/anesthesia complications. Likelihood of success in alleviating the patient's condition was discussed. Routine postoperative instructions will be reviewed with the patient and her family in detail after surgery.  The patient concurred with the proposed plan, giving  informed written consent for the surgery.  Preoperative prophylactic antibiotics, as indicated, and SCDs ordered on call to the OR.   ? ?She has had significant counseling regarding a hysterectomy for her issues.  We can't be sure that her reproductive organs are the reason she is having so much pain. Though, she does have heavy periods and this would be relieved by a hysterectomy. She declined all other modalities of treatment for her periods.   ? ?Thomasene Mohair, MD ?08/03/2021 3:40 PM    ?

## 2021-08-09 ENCOUNTER — Other Ambulatory Visit
Admission: RE | Admit: 2021-08-09 | Discharge: 2021-08-09 | Disposition: A | Discharge status: Home / Self Care | Payer: 59 | Source: Ambulatory Visit | Attending: Obstetrics and Gynecology | Admitting: Obstetrics and Gynecology

## 2021-08-09 ENCOUNTER — Other Ambulatory Visit: Payer: Self-pay

## 2021-08-09 DIAGNOSIS — Z01812 Encounter for preprocedural laboratory examination: Secondary | ICD-10-CM

## 2021-08-09 HISTORY — DX: Unspecified ovarian cyst, right side: N83.201

## 2021-08-09 HISTORY — DX: Essential (primary) hypertension: I10

## 2021-08-09 NOTE — Patient Instructions (Addendum)
Your procedure is scheduled on: 08/16/21 - Thursday ?Report to the Registration Desk on the 1st floor of the [facility]. ?To find out your arrival time, please call [phone] between 1PM - 3PM on: 08/15/21 - Wednesday ? ?REMEMBER: ?Instructions that are not followed completely may result in serious medical risk, up to and including death; or upon the discretion of your surgeon and anesthesiologist your surgery may need to be rescheduled. ? ?Do not eat food after midnight the night before surgery.  ?No gum chewing, lozengers or hard candies. ? ?You may however, drink CLEAR liquids up to 2 hours before you are scheduled to arrive for your surgery. Do not drink anything within 2 hours of your scheduled arrival time. ? ?Clear liquids include: ?- water  ?- apple juice without pulp ?- gatorade (not RED colors) ?- black coffee or tea (Do NOT add milk or creamers to the coffee or tea) ?Do NOT drink anything that is not on this list. ? ?In addition, your doctor has ordered for you to drink the provided  ?Ensure Pre-Surgery Clear Carbohydrate Drink  ?Drinking this carbohydrate drink up to two hours before surgery helps to reduce insulin resistance and improve patient outcomes. Please complete drinking 2 hours prior to scheduled arrival time. ? ?TAKE THESE MEDICATIONS THE MORNING OF SURGERY WITH A SIP OF WATER: ?- traMADol (ULTRAM) 50 MG tablet may take if needed. ? ?One week prior to surgery stop taking 08/10/21:  ?Stop Anti-inflammatories (NSAIDS) such as Advil, Aleve, Ibuprofen, Motrin, Naproxen, Naprosyn and Aspirin based products such as Excedrin, Goodys Powder, BC Powder. ? ?Do not take losartan (COZAAR) 50 MG tablet on the day of surgery. ? ?Stop ANY OVER THE COUNTER supplements until after surgery. ? ?You may however, continue to take Tylenol if needed for pain up until the day of surgery. ? ?No Alcohol for 24 hours before or after surgery. ? ?No Smoking including e-cigarettes for 24 hours prior to surgery.  ?No  chewable tobacco products for at least 6 hours prior to surgery.  ?No nicotine patches on the day of surgery. ? ?Do not use any "recreational" drugs for at least a week prior to your surgery.  ?Please be advised that the combination of cocaine and anesthesia may have negative outcomes, up to and including death. ?If you test positive for cocaine, your surgery will be cancelled. ? ?On the morning of surgery brush your teeth with toothpaste and water, you may rinse your mouth with mouthwash if you wish. ?Do not swallow any toothpaste or mouthwash. ? ?Use CHG Soap or wipes as directed on instruction sheet. ? ?Do not wear jewelry, make-up, hairpins, clips or nail polish. ? ?Do not wear lotions, powders, or perfumes.  ? ?Do not shave body from the neck down 48 hours prior to surgery just in case you cut yourself which could leave a site for infection.  ?Also, freshly shaved skin may become irritated if using the CHG soap. ? ?Contact lenses, hearing aids and dentures may not be worn into surgery. ? ?Do not bring valuables to the hospital. [facility] is not responsible for any missing/lost belongings or valuables.  ? ?Notify your doctor if there is any change in your medical condition (cold, fever, infection). ? ?Wear comfortable clothing (specific to your surgery type) to the hospital. ? ?After surgery, you can help prevent lung complications by doing breathing exercises.  ?Take deep breaths and cough every 1-2 hours. Your doctor may order a device called an Chiropodist to  help you take deep breaths. ?When coughing or sneezing, hold a pillow firmly against your incision with both hands. This is called ?splinting.? Doing this helps protect your incision. It also decreases belly discomfort. ? ?If you are being admitted to the hospital overnight, leave your suitcase in the car. ?After surgery it may be brought to your room. ? ?If you are being discharged the day of surgery, you will not be allowed to drive  home. ?You will need a responsible adult (18 years or older) to drive you home and stay with you that night.  ? ?If you are taking public transportation, you will need to have a responsible adult (18 years or older) with you. ?Please confirm with your physician that it is acceptable to use public transportation.  ? ?Please call the [facility] at [phone] if you have any questions about these instructions. ? ?Surgery Visitation Policy: ? ?Patients undergoing a surgery or procedure may have two family members or support persons with them as long as the person is not COVID-19 positive or experiencing its symptoms.  ? ?Inpatient Visitation:   ? ?Visiting hours are 7 a.m. to 8 p.m. ?Up to four visitors are allowed at one time in a patient room, including children. The visitors may rotate out with other people during the day. One designated support person (adult) may remain overnight.  ?

## 2021-08-10 ENCOUNTER — Other Ambulatory Visit
Admission: RE | Admit: 2021-08-10 | Discharge: 2021-08-10 | Disposition: A | Discharge status: Home / Self Care | Payer: 59 | Source: Ambulatory Visit | Attending: Obstetrics and Gynecology | Admitting: Obstetrics and Gynecology

## 2021-08-10 ENCOUNTER — Encounter: Payer: Self-pay | Admitting: Urgent Care

## 2021-08-10 DIAGNOSIS — Z01818 Encounter for other preprocedural examination: Secondary | ICD-10-CM | POA: Insufficient documentation

## 2021-08-10 DIAGNOSIS — Z01812 Encounter for preprocedural laboratory examination: Secondary | ICD-10-CM

## 2021-08-10 DIAGNOSIS — N920 Excessive and frequent menstruation with regular cycle: Secondary | ICD-10-CM | POA: Insufficient documentation

## 2021-08-10 DIAGNOSIS — R102 Pelvic and perineal pain: Secondary | ICD-10-CM | POA: Diagnosis not present

## 2021-08-10 DIAGNOSIS — G8929 Other chronic pain: Secondary | ICD-10-CM | POA: Insufficient documentation

## 2021-08-10 LAB — TYPE AND SCREEN
ABO/RH(D): A POS
Antibody Screen: NEGATIVE

## 2021-08-10 LAB — BASIC METABOLIC PANEL
Anion gap: 6 (ref 5–15)
BUN: 13 mg/dL (ref 6–20)
CO2: 26 mmol/L (ref 22–32)
Calcium: 9.3 mg/dL (ref 8.9–10.3)
Chloride: 103 mmol/L (ref 98–111)
Creatinine, Ser: 0.71 mg/dL (ref 0.44–1.00)
GFR, Estimated: >60 mL/min (ref >60)
Glucose, Bld: 107 mg/dL — ABNORMAL HIGH (ref 70–99)
Potassium: 3.7 mmol/L (ref 3.5–5.1)
Sodium: 135 mmol/L (ref 135–145)

## 2021-08-16 ENCOUNTER — Ambulatory Visit
Admission: RE | Admit: 2021-08-16 | Discharge: 2021-08-16 | Disposition: A | Discharge status: Home / Self Care | Payer: 59 | Attending: Obstetrics and Gynecology | Admitting: Obstetrics and Gynecology

## 2021-08-16 ENCOUNTER — Ambulatory Visit: Payer: 59 | Admitting: Anesthesiology

## 2021-08-16 ENCOUNTER — Encounter
Admission: RE | Disposition: A | Discharge status: Home / Self Care | Payer: Self-pay | Source: Home / Self Care | Attending: Obstetrics and Gynecology

## 2021-08-16 ENCOUNTER — Encounter: Payer: Self-pay | Admitting: Obstetrics and Gynecology

## 2021-08-16 ENCOUNTER — Other Ambulatory Visit: Payer: Self-pay

## 2021-08-16 DIAGNOSIS — N72 Inflammatory disease of cervix uteri: Secondary | ICD-10-CM | POA: Diagnosis not present

## 2021-08-16 DIAGNOSIS — R102 Pelvic and perineal pain: Secondary | ICD-10-CM | POA: Insufficient documentation

## 2021-08-16 DIAGNOSIS — F419 Anxiety disorder, unspecified: Secondary | ICD-10-CM | POA: Insufficient documentation

## 2021-08-16 DIAGNOSIS — I10 Essential (primary) hypertension: Secondary | ICD-10-CM | POA: Insufficient documentation

## 2021-08-16 DIAGNOSIS — G8929 Other chronic pain: Secondary | ICD-10-CM | POA: Insufficient documentation

## 2021-08-16 DIAGNOSIS — N920 Excessive and frequent menstruation with regular cycle: Secondary | ICD-10-CM | POA: Diagnosis not present

## 2021-08-16 DIAGNOSIS — N736 Female pelvic peritoneal adhesions (postinfective): Secondary | ICD-10-CM | POA: Insufficient documentation

## 2021-08-16 DIAGNOSIS — Z79899 Other long term (current) drug therapy: Secondary | ICD-10-CM | POA: Insufficient documentation

## 2021-08-16 DIAGNOSIS — Z87891 Personal history of nicotine dependence: Secondary | ICD-10-CM | POA: Diagnosis not present

## 2021-08-16 DIAGNOSIS — Z975 Presence of (intrauterine) contraceptive device: Secondary | ICD-10-CM | POA: Insufficient documentation

## 2021-08-16 HISTORY — PX: ROBOTIC ASSISTED LAPAROSCOPIC HYSTERECTOMY AND SALPINGECTOMY: SHX6379

## 2021-08-16 HISTORY — PX: CYSTOSCOPY: SHX5120

## 2021-08-16 LAB — POCT PREGNANCY, URINE
Preg Test, Ur: NEGATIVE
Preg Test, Ur: NEGATIVE

## 2021-08-16 SURGERY — XI ROBOTIC ASSISTED LAPAROSCOPIC HYSTERECTOMY AND SALPINGECTOMY
Anesthesia: General

## 2021-08-16 MED ORDER — CEFAZOLIN SODIUM-DEXTROSE 2-4 GM/100ML-% IV SOLN
2.0000 g | INTRAVENOUS | Status: AC
  Administered 2021-08-16: 2 g via INTRAVENOUS

## 2021-08-16 MED ORDER — ORAL CARE MOUTH RINSE: 15.0000 mL | Freq: Once | OROMUCOSAL | Status: AC

## 2021-08-16 MED ORDER — FENTANYL CITRATE (PF) 100 MCG/2ML IJ SOLN
25.0000 ug | INTRAMUSCULAR | Status: DC | PRN
  Administered 2021-08-16: 25 ug via INTRAVENOUS

## 2021-08-16 MED ORDER — OXYCODONE HCL 5 MG PO TABS: 5.0000 mg | ORAL_TABLET | Freq: Once | ORAL | Status: AC | PRN

## 2021-08-16 MED ORDER — POVIDONE-IODINE 10 % EX SWAB
2.0000 "application " | Freq: Once | CUTANEOUS | Status: AC
  Administered 2021-08-16: 2 via TOPICAL

## 2021-08-16 MED ORDER — OXYCODONE HCL 5 MG PO TABS
ORAL_TABLET | ORAL | Status: AC
  Administered 2021-08-16: 5 mg via ORAL
  Filled 2021-08-16: qty 1

## 2021-08-16 MED ORDER — OXYCODONE HCL 5 MG/5ML PO SOLN: 5.0000 mg | Freq: Once | ORAL | Status: AC | PRN

## 2021-08-16 MED ORDER — ACETAMINOPHEN 10 MG/ML IV SOLN
INTRAVENOUS | Status: AC
  Filled 2021-08-16: qty 100

## 2021-08-16 MED ORDER — BUPIVACAINE HCL 0.5 % IJ SOLN
INTRAMUSCULAR | Status: DC | PRN
  Administered 2021-08-16: 15 mL

## 2021-08-16 MED ORDER — PROPOFOL 10 MG/ML IV BOLUS
INTRAVENOUS | Status: AC
  Filled 2021-08-16: qty 20

## 2021-08-16 MED ORDER — BUPIVACAINE HCL (PF) 0.5 % IJ SOLN
INTRAMUSCULAR | Status: AC
  Filled 2021-08-16: qty 30

## 2021-08-16 MED ORDER — DEXAMETHASONE SODIUM PHOSPHATE 10 MG/ML IJ SOLN
INTRAMUSCULAR | Status: DC | PRN
  Administered 2021-08-16: 10 mg via INTRAVENOUS

## 2021-08-16 MED ORDER — LACTATED RINGERS IV SOLN: INTRAVENOUS | Status: DC

## 2021-08-16 MED ORDER — PROPOFOL 500 MG/50ML IV EMUL
INTRAVENOUS | Status: AC
  Filled 2021-08-16: qty 50

## 2021-08-16 MED ORDER — ACETAMINOPHEN 10 MG/ML IV SOLN: 1000.0000 mg | Freq: Once | INTRAVENOUS | Status: DC | PRN

## 2021-08-16 MED ORDER — FENTANYL CITRATE (PF) 100 MCG/2ML IJ SOLN
INTRAMUSCULAR | Status: AC
  Administered 2021-08-16: 25 ug via INTRAVENOUS
  Filled 2021-08-16: qty 2

## 2021-08-16 MED ORDER — FAMOTIDINE 20 MG PO TABS: 20.0000 mg | ORAL_TABLET | Freq: Once | ORAL | Status: AC

## 2021-08-16 MED ORDER — CEFAZOLIN SODIUM-DEXTROSE 2-4 GM/100ML-% IV SOLN
INTRAVENOUS | Status: AC
  Filled 2021-08-16: qty 100

## 2021-08-16 MED ORDER — PHENYLEPHRINE 80 MCG/ML (10ML) SYRINGE FOR IV PUSH (FOR BLOOD PRESSURE SUPPORT)
PREFILLED_SYRINGE | INTRAVENOUS | Status: DC | PRN
  Administered 2021-08-16: 160 ug via INTRAVENOUS
  Administered 2021-08-16 (×3): 80 ug via INTRAVENOUS

## 2021-08-16 MED ORDER — IBUPROFEN 600 MG PO TABS: 600.0000 mg | ORAL_TABLET | Freq: Four times a day (QID) | ORAL | 0 refills | Status: AC

## 2021-08-16 MED ORDER — FENTANYL CITRATE (PF) 100 MCG/2ML IJ SOLN
INTRAMUSCULAR | Status: DC | PRN
  Administered 2021-08-16: 100 ug via INTRAVENOUS

## 2021-08-16 MED ORDER — PROPOFOL 500 MG/50ML IV EMUL
INTRAVENOUS | Status: DC | PRN
  Administered 2021-08-16: 150 ug/kg/min via INTRAVENOUS

## 2021-08-16 MED ORDER — LIDOCAINE HCL (CARDIAC) PF 100 MG/5ML IV SOSY
PREFILLED_SYRINGE | INTRAVENOUS | Status: DC | PRN
  Administered 2021-08-16: 100 mg via INTRAVENOUS

## 2021-08-16 MED ORDER — SUGAMMADEX SODIUM 200 MG/2ML IV SOLN
INTRAVENOUS | Status: DC | PRN
  Administered 2021-08-16: 200 mg via INTRAVENOUS

## 2021-08-16 MED ORDER — DEXMEDETOMIDINE (PRECEDEX) IN NS 20 MCG/5ML (4 MCG/ML) IV SYRINGE
PREFILLED_SYRINGE | INTRAVENOUS | Status: DC | PRN
  Administered 2021-08-16: 12 ug via INTRAVENOUS

## 2021-08-16 MED ORDER — ONDANSETRON HCL 4 MG/2ML IJ SOLN: 4.0000 mg | Freq: Once | INTRAMUSCULAR | Status: DC | PRN

## 2021-08-16 MED ORDER — 0.9 % SODIUM CHLORIDE (POUR BTL) OPTIME
TOPICAL | Status: DC | PRN
  Administered 2021-08-16: 500 mL

## 2021-08-16 MED ORDER — HEMOSTATIC AGENTS (NO CHARGE) OPTIME
TOPICAL | Status: DC | PRN
  Administered 2021-08-16: 1 via TOPICAL

## 2021-08-16 MED ORDER — ROCURONIUM BROMIDE 100 MG/10ML IV SOLN
INTRAVENOUS | Status: DC | PRN
  Administered 2021-08-16: 40 mg via INTRAVENOUS
  Administered 2021-08-16: 60 mg via INTRAVENOUS

## 2021-08-16 MED ORDER — ONDANSETRON 4 MG PO TBDP: 4.0000 mg | ORAL_TABLET | Freq: Four times a day (QID) | ORAL | 0 refills | Status: DC | PRN

## 2021-08-16 MED ORDER — MIDAZOLAM HCL 2 MG/2ML IJ SOLN
INTRAMUSCULAR | Status: AC
  Filled 2021-08-16: qty 2

## 2021-08-16 MED ORDER — OXYCODONE-ACETAMINOPHEN 5-325 MG PO TABS: 1.0000 | ORAL_TABLET | Freq: Four times a day (QID) | ORAL | 0 refills | Status: AC | PRN

## 2021-08-16 MED ORDER — CHLORHEXIDINE GLUCONATE 0.12 % MT SOLN: 15.0000 mL | Freq: Once | OROMUCOSAL | Status: AC

## 2021-08-16 MED ORDER — PROPOFOL 10 MG/ML IV BOLUS
INTRAVENOUS | Status: DC | PRN
Start: 2021-08-16 — End: 2021-08-16
  Administered 2021-08-16: 180 mg via INTRAVENOUS

## 2021-08-16 MED ORDER — CHLORHEXIDINE GLUCONATE 0.12 % MT SOLN
OROMUCOSAL | Status: AC
  Administered 2021-08-16: 15 mL via OROMUCOSAL
  Filled 2021-08-16: qty 15

## 2021-08-16 MED ORDER — FAMOTIDINE 20 MG PO TABS
ORAL_TABLET | ORAL | Status: AC
  Administered 2021-08-16: 20 mg via ORAL
  Filled 2021-08-16: qty 1

## 2021-08-16 MED ORDER — MIDAZOLAM HCL 2 MG/2ML IJ SOLN
INTRAMUSCULAR | Status: DC | PRN
Start: 2021-08-16 — End: 2021-08-16
  Administered 2021-08-16: 2 mg via INTRAVENOUS

## 2021-08-16 MED ORDER — FENTANYL CITRATE (PF) 100 MCG/2ML IJ SOLN
INTRAMUSCULAR | Status: AC
  Filled 2021-08-16: qty 2

## 2021-08-16 MED ORDER — ACETAMINOPHEN 10 MG/ML IV SOLN
INTRAVENOUS | Status: DC | PRN
  Administered 2021-08-16: 1000 mg via INTRAVENOUS

## 2021-08-16 MED ORDER — ONDANSETRON HCL 4 MG/2ML IJ SOLN
INTRAMUSCULAR | Status: DC | PRN
  Administered 2021-08-16: 4 mg via INTRAVENOUS

## 2021-08-16 SURGICAL SUPPLY — 80 items
ADH SKN CLS APL DERMABOND .7 (GAUZE/BANDAGES/DRESSINGS) ×2
APL PRP STRL LF DISP 70% ISPRP (MISCELLANEOUS)
APL SRG 38 LTWT LNG FL B (MISCELLANEOUS) ×2
APPLICATOR ARISTA FLEXITIP XL (MISCELLANEOUS) ×1 IMPLANT
BACTOSHIELD CHG 4% 4OZ (MISCELLANEOUS) ×1
BAG DRN RND TRDRP ANRFLXCHMBR (UROLOGICAL SUPPLIES) ×2
BAG URINE DRAIN 2000ML AR STRL (UROLOGICAL SUPPLIES) ×3 IMPLANT
BLADE SURG SZ11 CARB STEEL (BLADE) ×3 IMPLANT
CANNULA CAP OBTURATR AIRSEAL 8 (CAP) ×3 IMPLANT
CATH FOLEY 2WAY  5CC 16FR (CATHETERS) ×1
CATH FOLEY 2WAY 5CC 16FR (CATHETERS) ×2
CATH URTH 16FR FL 2W BLN LF (CATHETERS) ×2 IMPLANT
CHLORAPREP W/TINT 26 (MISCELLANEOUS) ×2 IMPLANT
COVER MAYO STAND REUSABLE (DRAPES) ×3 IMPLANT
COVER TIP SHEARS 8 DVNC (MISCELLANEOUS) ×2 IMPLANT
COVER TIP SHEARS 8MM DA VINCI (MISCELLANEOUS) ×1
DEFOGGER SCOPE WARMER CLEARIFY (MISCELLANEOUS) ×3 IMPLANT
DERMABOND ADVANCED (GAUZE/BANDAGES/DRESSINGS) ×1
DERMABOND ADVANCED .7 DNX12 (GAUZE/BANDAGES/DRESSINGS) ×2 IMPLANT
DRAPE 3/4 80X56 (DRAPES) ×1 IMPLANT
DRAPE ARM DVNC X/XI (DISPOSABLE) ×8 IMPLANT
DRAPE DA VINCI XI ARM (DISPOSABLE) ×4
DRAPE ROBOT W/ LEGGING 30X125 (DRAPES) ×3 IMPLANT
DRAPE UNDER BUTTOCK W/FLU (DRAPES) ×3 IMPLANT
ELECT REM PT RETURN 9FT ADLT (ELECTROSURGICAL) ×3
ELECTRODE REM PT RTRN 9FT ADLT (ELECTROSURGICAL) ×2 IMPLANT
GAUZE 4X4 16PLY ~~LOC~~+RFID DBL (SPONGE) ×3 IMPLANT
GLOVE BIO SURGEON STRL SZ7 (GLOVE) ×9 IMPLANT
GLOVE SURG UNDER POLY LF SZ7.5 (GLOVE) ×9 IMPLANT
GOWN STRL REUS W/ TWL LRG LVL3 (GOWN DISPOSABLE) ×6 IMPLANT
GOWN STRL REUS W/ TWL XL LVL3 (GOWN DISPOSABLE) ×2 IMPLANT
GOWN STRL REUS W/TWL LRG LVL3 (GOWN DISPOSABLE) ×9
GOWN STRL REUS W/TWL XL LVL3 (GOWN DISPOSABLE) ×3
GYRUS RUMI II 3.5CM BLUE (DISPOSABLE) ×3
HEMOSTAT ARISTA ABSORB 3G PWDR (HEMOSTASIS) ×1 IMPLANT
IRRIGATION STRYKERFLOW (MISCELLANEOUS) IMPLANT
IRRIGATOR STRYKERFLOW (MISCELLANEOUS) ×3
IV LACTATED RINGERS 1000ML (IV SOLUTION) ×3 IMPLANT
IV NS 1000ML (IV SOLUTION) ×3
IV NS 1000ML BAXH (IV SOLUTION) ×2 IMPLANT
KIT PINK PAD W/HEAD ARE REST (MISCELLANEOUS) ×3
KIT PINK PAD W/HEAD ARM REST (MISCELLANEOUS) ×2 IMPLANT
KIT TURNOVER CYSTO (KITS) ×3 IMPLANT
LABEL OR SOLS (LABEL) ×3 IMPLANT
MANIFOLD NEPTUNE II (INSTRUMENTS) ×3 IMPLANT
MANIPULATOR VCARE LG CRV RETR (MISCELLANEOUS) IMPLANT
MANIPULATOR VCARE SML CRV RETR (MISCELLANEOUS) IMPLANT
MANIPULATOR VCARE STD CRV RETR (MISCELLANEOUS) IMPLANT
NEEDLE HYPO 22GX1.5 SAFETY (NEEDLE) ×3 IMPLANT
NS IRRIG 500ML POUR BTL (IV SOLUTION) ×3 IMPLANT
OBTURATOR OPTICAL STANDARD 8MM (TROCAR) ×1
OBTURATOR OPTICAL STND 8 DVNC (TROCAR) ×2
OBTURATOR OPTICALSTD 8 DVNC (TROCAR) ×2 IMPLANT
OCCLUDER COLPOPNEUMO (BALLOONS) ×3 IMPLANT
PACK LAP CHOLECYSTECTOMY (MISCELLANEOUS) ×3 IMPLANT
PAD OB MATERNITY 4.3X12.25 (PERSONAL CARE ITEMS) ×3 IMPLANT
PAD PREP 24X41 OB/GYN DISP (PERSONAL CARE ITEMS) ×3 IMPLANT
RUMI II GYRUS 3.5CM BLUE (DISPOSABLE) IMPLANT
SCISSORS METZENBAUM CVD 33 (INSTRUMENTS) IMPLANT
SCRUB CHG 4% DYNA-HEX 4OZ (MISCELLANEOUS) ×2 IMPLANT
SEAL CANN UNIV 5-8 DVNC XI (MISCELLANEOUS) ×8 IMPLANT
SEAL XI 5MM-8MM UNIVERSAL (MISCELLANEOUS) ×4
SEALER VESSEL DA VINCI XI (MISCELLANEOUS) ×1
SEALER VESSEL EXT DVNC XI (MISCELLANEOUS) ×2 IMPLANT
SET CYSTO W/LG BORE CLAMP LF (SET/KITS/TRAYS/PACK) ×3 IMPLANT
SET TUBE FILTERED XL AIRSEAL (SET/KITS/TRAYS/PACK) ×3 IMPLANT
SOLUTION ELECTROLUBE (MISCELLANEOUS) ×3 IMPLANT
SPONGE T-LAP 18X18 ~~LOC~~+RFID (SPONGE) ×1 IMPLANT
SURGILUBE 2OZ TUBE FLIPTOP (MISCELLANEOUS) ×3 IMPLANT
SUT DVC VLOC 180 2-0 12IN GS21 (SUTURE) ×3
SUT MNCRL 4-0 (SUTURE) ×3
SUT MNCRL 4-0 27XMFL (SUTURE) ×2
SUT STRATAFIX 0 PDS+ CT-2 23 (SUTURE)
SUTURE DVC VL 180 2-0 12INGS21 (SUTURE) IMPLANT
SUTURE MNCRL 4-0 27XMF (SUTURE) ×2 IMPLANT
SUTURE STRATFX 0 PDS+ CT-2 23 (SUTURE) ×2 IMPLANT
SYR 10ML LL (SYRINGE) ×3 IMPLANT
SYR 50ML LL SCALE MARK (SYRINGE) ×3 IMPLANT
TIP UTERINE 6.7X8CM BLUE DISP (MISCELLANEOUS) ×1 IMPLANT
WATER STERILE IRR 500ML POUR (IV SOLUTION) ×3 IMPLANT

## 2021-08-16 NOTE — Discharge Instructions (Signed)
AMBULATORY SURGERY  ?DISCHARGE INSTRUCTIONS ? ? ?The drugs that you were given will stay in your system until tomorrow so for the next 24 hours you should not: ? ?Drive an automobile ?Make any legal decisions ?Drink any alcoholic beverage ? ? ?You may resume regular meals tomorrow.  Today it is better to start with liquids and gradually work up to solid foods. ? ?You may eat anything you prefer, but it is better to start with liquids, then soup and crackers, and gradually work up to solid foods. ? ? ?Please notify your doctor immediately if you have any unusual bleeding, trouble breathing, redness and pain at the surgery site, drainage, fever, or pain not relieved by medication. ? ? ? ?Additional Instructions: ? ? ? ?Please contact your physician with any problems or Same Day Surgery at [phone], Monday through Friday 6 am to 4 pm, or [facility] number at [phone].  ?

## 2021-08-16 NOTE — Anesthesia Procedure Notes (Signed)
Procedure Name: Intubation ?Date/Time: 08/16/2021 7:44 AM ?Performed by: Aline Brochure, CRNA ?Pre-anesthesia Checklist: Patient identified, Patient being monitored, Timeout performed, Emergency Drugs available and Suction available ?Patient Re-evaluated:Patient Re-evaluated prior to induction ?Oxygen Delivery Method: Circle system utilized ?Preoxygenation: Pre-oxygenation with 100% oxygen ?Induction Type: IV induction ?Ventilation: Mask ventilation without difficulty ?Laryngoscope Size: 3 and McGraph ?Grade View: Grade II ?Tube type: Oral ?Tube size: 6.5 mm ?Number of attempts: 1 ?Airway Equipment and Method: Stylet ?Placement Confirmation: ETT inserted through vocal cords under direct vision, positive ETCO2 and breath sounds checked- equal and bilateral ?Secured at: 21 cm ?Tube secured with: Tape ?Dental Injury: Teeth and Oropharynx as per pre-operative assessment  ?Difficulty Due To: Difficult Airway- due to anterior larynx ? ? ? ? ?

## 2021-08-16 NOTE — Interval H&P Note (Signed)
History and Physical Interval Note: ? ?08/16/2021 ?7:23 AM ? ?Carolyn Ward  has presented today for surgery, with the diagnosis of chronic pelvic pain.  The various methods of treatment have been discussed with the patient and family. After consideration of risks, benefits and other options for treatment, the patient has consented to  Procedure(s): ?XI ROBOTIC ASSISTED LAPAROSCOPIC HYSTERECTOMY AND SALPINGECTOMY (Bilateral) ?CYSTOSCOPY (N/A) as a surgical intervention.  The patient's history has been reviewed, patient examined, no change in status, stable for surgery.  I have reviewed the patient's chart and labs.  Questions were answered to the patient's satisfaction.  Consents reviewed. The patient wishes to proceed. ? ?Prentice Docker, MD, FACOG ?[facility] ?08/16/2021 7:23 AM   ?

## 2021-08-16 NOTE — Anesthesia Preprocedure Evaluation (Signed)
Anesthesia Evaluation  ?Patient identified by MRN, date of birth, ID band ?Patient awake ? ? ? ?Reviewed: ?Allergy & Precautions, NPO status , Patient's Chart, lab work & pertinent test results ? ?History of Anesthesia Complications ?Negative for: history of anesthetic complications ? ?Airway ?Mallampati: II ? ?TM Distance: >3 FB ?Neck ROM: Full ? ? ? Dental ?no notable dental hx. ?(+) Teeth Intact ?  ?Pulmonary ?neg pulmonary ROS, neg sleep apnea, neg COPD, Patient abstained from smoking.Not current smoker,  ?  ?Pulmonary exam normal ?breath sounds clear to auscultation ? ? ? ? ? ? Cardiovascular ?Exercise Tolerance: Good ?METShypertension, Pt. on medications ?(-) CAD and (-) Past MI negative cardio ROS ? ?(-) dysrhythmias  ?Rhythm:Regular Rate:Normal ?- Systolic murmurs ? ?  ?Neuro/Psych ?PSYCHIATRIC DISORDERS Anxiety negative neurological ROS ?   ? GI/Hepatic ?neg GERD  ,(+)  ?  ? (-) substance abuse ? ,   ?Endo/Other  ?neg diabetes ? Renal/GU ?negative Renal ROS  ? ?  ?Musculoskeletal ? ? Abdominal ?  ?Peds ? Hematology ?  ?Anesthesia Other Findings ?Past Medical History: ?No date: Anxiety ?No date: Cyst of right ovary ?No date: Hypertension ?No date: Seasonal allergies ? Reproductive/Obstetrics ? ?  ? ? ? ? ? ? ? ? ? ? ? ? ? ?  ?  ? ? ? ? ? ? ? ? ?Anesthesia Physical ?Anesthesia Plan ? ?ASA: 2 ? ?Anesthesia Plan: General  ? ?Post-op Pain Management: Ofirmev IV (intra-op)* and Toradol IV (intra-op)*  ? ?Induction: Intravenous ? ?PONV Risk Score and Plan: 4 or greater and Ondansetron, Dexamethasone, Midazolam, TIVA and Propofol infusion ? ?Airway Management Planned: Oral ETT ? ?Additional Equipment: None ? ?Intra-op Plan:  ? ?Post-operative Plan: Extubation in OR ? ?Informed Consent: I have reviewed the patients History and Physical, chart, labs and discussed the procedure including the risks, benefits and alternatives for the proposed anesthesia with the patient or authorized  representative who has indicated his/her understanding and acceptance.  ? ? ? ?Dental advisory given ? ?Plan Discussed with: CRNA and Surgeon ? ?Anesthesia Plan Comments: (Discussed risks of anesthesia with patient, including PONV, sore throat, lip/dental/eye damage. Rare risks discussed as well, such as cardiorespiratory and neurological sequelae, and allergic reactions. Discussed the role of CRNA in patient's perioperative care. Patient understands.)  ? ? ? ? ? ? ?Anesthesia Quick Evaluation ? ?

## 2021-08-16 NOTE — Op Note (Signed)
?Operative Note   ? ?Name: Carolyn Ward  ?Date of Service: 08/16/2021  ?DOB: 07/22/1980  ?MRN: [id number]  ? ?Pre-Operative Diagnosis:  ?1) Menorrhagia with regular cycle [N92.0] ?2) Pelvic pain in female [R10.2] ? ?Post-Operative Diagnosis:  ?1) Menorrhagia with regular cycle [N92.0] ?2) Pelvic pain in female [R10.2] ? ?Procedures:  ?1. Robot assisted Total Laparoscopic Hysterectomy, bilateral salpingectomy  ?2. Cystoscopy ?3. Removal of intrauterine device  ? ?Primary Surgeon: Thomasene Mohair, MD ?  ?EBL: 20 mL  ? ?IVF: 1,200 mL  ? ?Urine output: 450 mL clear urine at end of case ? ?Specimens: uterus with cervix and bilateral fallopian tubes ? ?Drains: none ? ?Complications: None  ? ?Disposition: PACU  ? ?Condition: Stable  ? ?Findings:  ?1) Normal appearing uterus, bilateral fallopian tubes, and ovaries ?2) Normal bladder after procedure without evidence of bladder wall defect.  Visualization of efflux of urine from the bilateral ureteral orifices.  ?3) Paragard IUD in uterus at beginning of procedure ? ?Procedure Summary:  ?The patient was taken to the operating room where general anesthesia was administered and found to be adequate. She was placed in the dorsal supine lithotomy position in [place] stirrups and prepped and draped in usual the sterile fashion. After a timeout was called an indwelling catheter was placed in her bladder.  A sterile speculum was placed in her vagina.  The intrauterine device strings were visualized and grasped with a ring forceps. Gentle traction was applied to the strings and the intrauterine device was removed intact. The anterior lip of the cervix was grasped with the single-tooth tenaculum.  The cervix was serially dilated to an 11 Pratt dilator.  The large RUMI device was placed in accordance to the manufacturer's recommendations.  The speculum and tenaculum were removed. ?  ?Attention was turned to the abdomen where after injection of local anesthetic, an 8 mm supraumbilical  incision was made with the scalpel. Entry into the abdomen was obtained via Optiview trocar technique (a blunt entry technique with camera visualization through the obturator upon entry). Verification of entry into the abdomen was obtained using opening pressures. The abdomen was insufflated with CO2. The camera was introduced through the trocar with verification of atraumatic entry.  Right and left abdominal entry sites were created after injection of local anesthetic about 8 cm lateral to the umbilical port in accordance with the Intuitive manufacturer's recommendations.  An additional port was placed 8 cm lateral to the right abdominal port with verification of clearance above the iliac crest by more than 2 cm.  The port sites were 8 mm.  The intuitive trochars were introduced under intra-abdominal camera visualization without difficulty ?  ?The XI robot was docked on the patient's left.  Clearance was verified from the patient's legs.  Through the umbilical port the camera was placed.  Through the port attached to arm 3 the monopolar scissors were placed.  Through the port attached to arm 4 the forced bipolar forceps were was placed.  The vessel sealer was attached to port 1. ?  ?After inspection of the abdomen and pelvis with the above-noted findings, the bilateral ureters were identified and found to be well away from the operative area of interest. It was required to take down some filmy adhesions from the left pelvic sidewall where the bowel was lightly attached.  This was accomplished without difficulty and without coming near the bowel.  The right fallopian tube was grasped at the fimbriated end and was transected using the vessel sealer  along the mesosalpinx in a lateral to medial fashion. The vessel sealer was used to transect the right round ligament and the utero-ovarian ligament was transected. Tissue was divided along the right broad ligament to the level of the interior cervical os. The lower  uterine segment was identified. Bladder tissue was dissected off the lower uterine segment and cervix without difficulty. The right uterine artery was skeletonized and identified and after ligation was transected with the Vessel Sealer device. The same procedure was carried out on the left side. The colpotomy was performed using monopolar electrocautery in a circumferential fashion following the KOH ring.  The uterus and fallopian tubes and cervix were removed through the vagina. ?  ?Closure of the vaginal cuff was undertaken using the V-lock stitch in a running fashion. All vascular pedicles were inspected and found to be hemostatic.  The intra-abdominal pressure was lowered to 5 mmHg and hemostasis was again noted under more physiologic pressure.  Arista was placed along all vascular pedicles to ensure ongoing hemostasis. All instruments removed from the robotic ports.  The robot was undocked from the patient.  The abdomen was then desufflated of CO2 with the help of five deep breaths from anesthesia.  All trochars were then removed.  All skin incisions were closed using 4-0 Vicryl in a subcuticular fashion and reinforced using surgical skin glue. ?  ?Cystoscopy was undertaken at this point. The Foley catheter was removed and the 70? cystoscope was gently introduced through the urethra. The bladder survey was undertaken with efflux of urine from both orifices noted. There were no defects noted in the bladder wall. The cystoscope was utilized to fully empty the bladder and then the cystoscope was removed without replacing the Foley catheter.  ? ?The patient tolerated the procedure well.  Sponge, lap, needle, and instrument counts were correct x 2.  VTE prophylaxis: SCDs. Antibiotic prophylaxis: Ancef 2 grams IV prior to the start of the case. She was awakened in the operating room and was taken to the PACU in stable condition.  ? ?Thomasene Mohair, MD, FACOG ?[facility] ?08/16/2021 10:22 AM   ? ?

## 2021-08-16 NOTE — Anesthesia Postprocedure Evaluation (Signed)
Anesthesia Post Note ? ?Patient: Carolyn Ward ? ?Procedure(s) Performed: XI ROBOTIC ASSISTED LAPAROSCOPIC HYSTERECTOMY AND SALPINGECTOMY (Bilateral) ?CYSTOSCOPY ? ?Patient location during evaluation: PACU ?Anesthesia Type: General ?Level of consciousness: awake and alert ?Pain management: pain level controlled ?Vital Signs Assessment: post-procedure vital signs reviewed and stable ?Respiratory status: spontaneous breathing, nonlabored ventilation, respiratory function stable and patient connected to nasal cannula oxygen ?Cardiovascular status: blood pressure returned to baseline and stable ?Postop Assessment: no apparent nausea or vomiting ?Anesthetic complications: no ? ? ?No notable events documented. ? ? ?Last Vitals:  ?Vitals:  ? 08/16/21 1053 08/16/21 1108  ?BP:  106/72  ?Pulse: 62 (!) 59  ?Resp: 15 18  ?Temp: 36.6 ?C 36.4 ?C  ?SpO2: 95% 96%  ?  ?Last Pain:  ?Vitals:  ? 08/16/21 1053  ?TempSrc:   ?PainSc: 4   ? ? ?  ?  ?  ?  ?  ?  ? ?Corinda Gubler ? ? ? ? ?

## 2021-08-16 NOTE — Transfer of Care (Signed)
Immediate Anesthesia Transfer of Care Note ? ?Patient: Carolyn Ward ? ?Procedure(s) Performed: XI ROBOTIC ASSISTED LAPAROSCOPIC HYSTERECTOMY AND SALPINGECTOMY (Bilateral) ?CYSTOSCOPY ? ?Patient Location: PACU ? ?Anesthesia Type:General ? ?Level of Consciousness: awake, alert  and oriented ? ?Airway & Oxygen Therapy: Patient Spontanous Breathing ? ?Post-op Assessment: Report given to RN and Post -op Vital signs reviewed and stable ? ?Post vital signs: Reviewed and stable ? ?Last Vitals:  ?Vitals Value Taken Time  ?BP 90/57 08/16/21 1019  ?Temp    ?Pulse 73 08/16/21 1022  ?Resp 18 08/16/21 1022  ?SpO2 94 % 08/16/21 1022  ?Vitals shown include unvalidated device data. ? ?Last Pain:  ?Vitals:  ? 08/16/21 0640  ?TempSrc: Oral  ?PainSc: 0-No pain  ?   ? ?  ? ?Complications: No notable events documented. ?

## 2021-08-20 LAB — SURGICAL PATHOLOGY

## 2021-08-21 LAB — ABO/RH: ABO/RH(D): A POS

## 2021-09-04 ENCOUNTER — Other Ambulatory Visit: Payer: Self-pay | Admitting: Obstetrics and Gynecology

## 2021-09-04 DIAGNOSIS — N99820 Postprocedural hemorrhage and hematoma of a genitourinary system organ or structure following a genitourinary system procedure: Secondary | ICD-10-CM

## 2021-09-11 ENCOUNTER — Ambulatory Visit
Admission: RE | Admit: 2021-09-11 | Discharge: 2021-09-11 | Disposition: A | Discharge status: Home / Self Care | Payer: 59 | Source: Ambulatory Visit | Attending: Obstetrics and Gynecology | Admitting: Obstetrics and Gynecology

## 2021-09-11 DIAGNOSIS — N99820 Postprocedural hemorrhage and hematoma of a genitourinary system organ or structure following a genitourinary system procedure: Secondary | ICD-10-CM | POA: Diagnosis present

## 2022-03-11 ENCOUNTER — Ambulatory Visit
Admission: EM | Admit: 2022-03-11 | Discharge: 2022-03-11 | Disposition: A | Discharge status: Home / Self Care | Payer: 59 | Attending: Urgent Care | Admitting: Urgent Care

## 2022-03-11 DIAGNOSIS — J019 Acute sinusitis, unspecified: Secondary | ICD-10-CM | POA: Diagnosis not present

## 2022-03-11 MED ORDER — PREDNISONE 50 MG PO TABS: 50.0000 mg | ORAL_TABLET | Freq: Every day | ORAL | 0 refills | Status: AC

## 2022-03-11 MED ORDER — AMOXICILLIN-POT CLAVULANATE 875-125 MG PO TABS: 1.0000 | ORAL_TABLET | Freq: Two times a day (BID) | ORAL | 0 refills | Status: DC

## 2022-03-11 NOTE — Discharge Instructions (Addendum)
You have a sinus infection. Please start taking the antibiotic, Augmentin, twice daily with food. Take it for all 10 days, do not stop early just because you feel better. Take an over the counter probiotic or yogurt daily to help prevent diarrhea/ yeast infection. Continue taking zyrtec once daily to help clear up the mucous. STOP Flonase as it is causing nose bleeds - use rhinaris or nasogel instead. It is also recommended that you use nasal saline/ sinus washes to cleans the sinus passages. Hot steam from a shower or vaporizer may also be beneficial to help open up the upper airway. Eucalyptus can be helpful. If any worsening symptoms such as headache, fever, or shortness of breath, please return for evaluation.  Nasogel or other nasal gel for dry noses [employer] for nasal rinsing.

## 2022-03-11 NOTE — ED Provider Notes (Signed)
[facility]    CSN: [id number] Arrival date & time: 03/11/22  1113      History   Chief Complaint Chief Complaint  Patient presents with   Cough   sinus pressure   Nasal Congestion    HPI Carolyn Ward is a 41 y.o. female.   Pleasant 41 year old female presents today due to concerns of sinusitis.  She has a history of seasonal allergies and is known of sinus infections.  Just recently got over strep throat.  States for the past 1 to 1-1/2 weeks, she has had severe frontal and maxillary sinus pain.  Continues to take DayQuil, NyQuil, Zyrtec, Flonase.  States her left nare has been bleeding recently.  Some to sensitivity but no fever.  Reports pressure in her ears.  Reports a cough, but denies shortness of breath or chest pain.    Cough   Past Medical History:  Diagnosis Date   Anxiety    Cyst of right ovary    Hypertension    Seasonal allergies     Patient Active Problem List   Diagnosis Date Noted   Menorrhagia with regular cycle 08/16/2021   Pelvic pain in female 08/16/2021    Past Surgical History:  Procedure Laterality Date   CYSTOSCOPY N/A 08/16/2021   Procedure: CYSTOSCOPY;  Surgeon: Conard Novak, MD;  Location: [facility];  Service: Gynecology;  Laterality: N/A;   NO PAST SURGERIES     ROBOTIC ASSISTED LAPAROSCOPIC HYSTERECTOMY AND SALPINGECTOMY Bilateral 08/16/2021   Procedure: XI ROBOTIC ASSISTED LAPAROSCOPIC HYSTERECTOMY AND SALPINGECTOMY;  Surgeon: Conard Novak, MD;  Location: [facility];  Service: Gynecology;  Laterality: Bilateral;    OB History     Gravida  3   Para  1   Term      Preterm      AB      Living         SAB      IAB      Ectopic      Multiple      Live Births               Home Medications    Prior to Admission medications   Medication Sig Start Date End Date Taking? Authorizing Provider  amoxicillin-clavulanate (AUGMENTIN) 875-125 MG tablet Take 1 tablet by mouth every 12 (twelve)  hours. 03/11/22  Yes Didi Ganaway L, PA  predniSONE (DELTASONE) 50 MG tablet Take 1 tablet (50 mg total) by mouth daily with breakfast for 5 days. 03/11/22 03/16/22 Yes Deiondre Harrower L, PA  albuterol (VENTOLIN HFA) 108 (90 Base) MCG/ACT inhaler Inhale 1-2 puffs into the lungs every 6 (six) hours as needed for wheezing or shortness of breath. Patient not taking: Reported on 08/03/2021 05/07/20   Evon Slack, PA-C  cetirizine (ZYRTEC) 10 MG tablet Take 10 mg by mouth daily.    [provider]  ibuprofen (ADVIL) 600 MG tablet Take 1 tablet (600 mg total) by mouth every 6 (six) hours. 08/16/21   Conard Novak, MD  losartan (COZAAR) 50 MG tablet Take 50 mg by mouth daily. 07/18/21   [provider]  ondansetron (ZOFRAN-ODT) 4 MG disintegrating tablet Take 1 tablet (4 mg total) by mouth every 6 (six) hours as needed for nausea. 08/16/21   Conard Novak, MD  sertraline (ZOLOFT) 50 MG tablet Take 50 mg by mouth daily.    [provider]    Family History Family History  Problem Relation Age of  Onset   Hypertension Mother    Other Father        unknown medical history    Social History Social History   Tobacco Use   Smoking status: Never   Smokeless tobacco: Never  Vaping Use   Vaping Use: Never used  Substance Use Topics   Alcohol use: Yes    Comment: socially   Drug use: Never     Allergies   Patient has no known allergies.   Review of Systems Review of Systems  Respiratory:  Positive for cough.   As per HPI   Physical Exam Triage Vital Signs ED Triage Vitals  Enc Vitals Group     BP 03/11/22 1247 130/83     Pulse Rate 03/11/22 1247 78     Resp --      Temp 03/11/22 1249 97.8 F (36.6 C)     Temp Source 03/11/22 1247 Oral     SpO2 03/11/22 1247 99 %     Weight 03/11/22 1245 200 lb (90.7 kg)     Height 03/11/22 1245 5\' 8"  (1.727 m)     Head Circumference --      Peak Flow --      Pain Score 03/11/22 1245 3     Pain Loc --       Pain Edu? --      Excl. in GC? --    No data found.  Updated Vital Signs BP 130/83 (BP Location: Left Arm)   Pulse 78   Temp 97.8 F (36.6 C) (Oral)   Ht 5\' 8"  (1.727 m)   Wt 200 lb (90.7 kg)   SpO2 99%   BMI 30.41 kg/m   Visual Acuity Right Eye Distance:   Left Eye Distance:   Bilateral Distance:    Right Eye Near:   Left Eye Near:    Bilateral Near:     Physical Exam Vitals and nursing note reviewed.  Constitutional:      General: She is not in acute distress.    Appearance: Normal appearance. She is well-developed. She is not ill-appearing, toxic-appearing or diaphoretic.  HENT:     Head: Normocephalic and atraumatic.     Right Ear: Tympanic membrane, ear canal and external ear normal. There is no impacted cerumen.     Left Ear: Tympanic membrane, ear canal and external ear normal. There is no impacted cerumen.     Nose: Rhinorrhea present. No congestion.     Right Turbinates: Enlarged and swollen.     Left Turbinates: Enlarged and swollen.     Right Sinus: Maxillary sinus tenderness and frontal sinus tenderness present.     Left Sinus: Maxillary sinus tenderness and frontal sinus tenderness present.     Comments: L nare not actively bleeding, but is very irritated and shows signs of recent bleed.    Mouth/Throat:     Lips: Pink.     Mouth: Mucous membranes are moist.     Pharynx: Oropharynx is clear. No oropharyngeal exudate or posterior oropharyngeal erythema.     Comments: Post nasal drainage noted Eyes:     General: No scleral icterus.       Right eye: No discharge.        Left eye: No discharge.     Extraocular Movements: Extraocular movements intact.     Conjunctiva/sclera: Conjunctivae normal.     Pupils: Pupils are equal, round, and reactive to light.  Cardiovascular:     Rate and Rhythm: Normal rate and  regular rhythm.     Heart sounds: No murmur heard. Pulmonary:     Effort: Pulmonary effort is normal. No accessory muscle usage, respiratory  distress or retractions.     Breath sounds: Normal breath sounds and air entry. No stridor, decreased air movement or transmitted upper airway sounds. No decreased breath sounds, wheezing, rhonchi or rales.  Abdominal:     Palpations: Abdomen is soft.     Tenderness: There is no abdominal tenderness.  Musculoskeletal:        General: No swelling.     Cervical back: Normal range of motion and neck supple. No rigidity or tenderness.  Lymphadenopathy:     Cervical: No cervical adenopathy.  Skin:    General: Skin is warm and dry.     Capillary Refill: Capillary refill takes less than 2 seconds.  Neurological:     Mental Status: She is alert.  Psychiatric:        Mood and Affect: Mood normal.      UC Treatments / Results  Labs (all labs ordered are listed, but only abnormal results are displayed) Labs Reviewed - No data to display  EKG   Radiology No results found.  Procedures Procedures (including critical care time)  Medications Ordered in UC Medications - No data to display  Initial Impression / Assessment and Plan / UC Course  I have reviewed the triage vital signs and the nursing notes.  Pertinent labs & imaging results that were available during my care of the patient were reviewed by me and considered in my medical decision making (see chart for details).     Acute sinusitis - will do combo abx /steroids to cover all possible causes. Stop  flonase due to adverse side effects, switch to nasal gel and saline. Additional OTC supportive measures reviewed.   Final Clinical Impressions(s) / UC Diagnoses   Final diagnoses:  Acute non-recurrent sinusitis, unspecified location     Discharge Instructions      You have a sinus infection. Please start taking the antibiotic, Augmentin, twice daily with food. Take it for all 10 days, do not stop early just because you feel better. Take an over the counter probiotic or yogurt daily to help prevent diarrhea/ yeast  infection. Continue taking zyrtec once daily to help clear up the mucous. STOP Flonase as it is causing nose bleeds - use rhinaris or nasogel instead. It is also recommended that you use nasal saline/ sinus washes to cleans the sinus passages. Hot steam from a shower or vaporizer may also be beneficial to help open up the upper airway. Eucalyptus can be helpful. If any worsening symptoms such as headache, fever, or shortness of breath, please return for evaluation.  Nasogel or other nasal gel for dry noses [employer] for nasal rinsing.    ED Prescriptions     Medication Sig Dispense Auth. Provider   amoxicillin-clavulanate (AUGMENTIN) 875-125 MG tablet Take 1 tablet by mouth every 12 (twelve) hours. 14 tablet Bowie Delia L, PA   predniSONE (DELTASONE) 50 MG tablet Take 1 tablet (50 mg total) by mouth daily with breakfast for 5 days. 5 tablet Nastassja Witkop L, [place]      PDMP not reviewed this encounter.   Maretta Bees, [place] 03/12/22 2335

## 2022-03-11 NOTE — ED Triage Notes (Signed)
Patient reports that she has sinus pressure, nasal congestion, and cough -- started about a week ago.

## 2023-09-20 IMAGING — US US PELVIS COMPLETE
1 series · 14 of 25 positions shown · non-contrast
Comparison: None Available.

CLINICAL DATA: Postoperative vaginal bleeding. Hysterectomy on
08/16/2021.

EXAM:
TRANSABDOMINAL ULTRASOUND OF PELVIS
TECHNIQUE: Transabdominal ultrasound examination of the pelvis was performed
including evaluation of the uterus, ovaries, adnexal regions, and
pelvic cul-de-sac.

[Series 1: us pelvis (transabdominal only) · 14 of 33 slices shown]
[im 1/33]
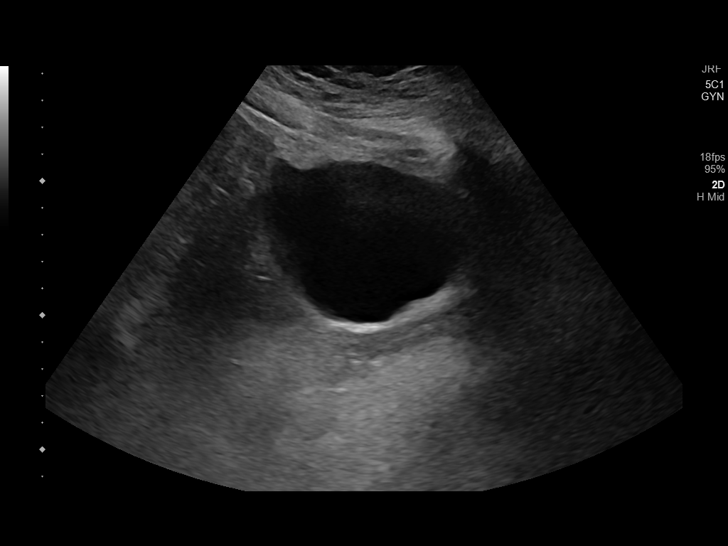
[im 3/33]
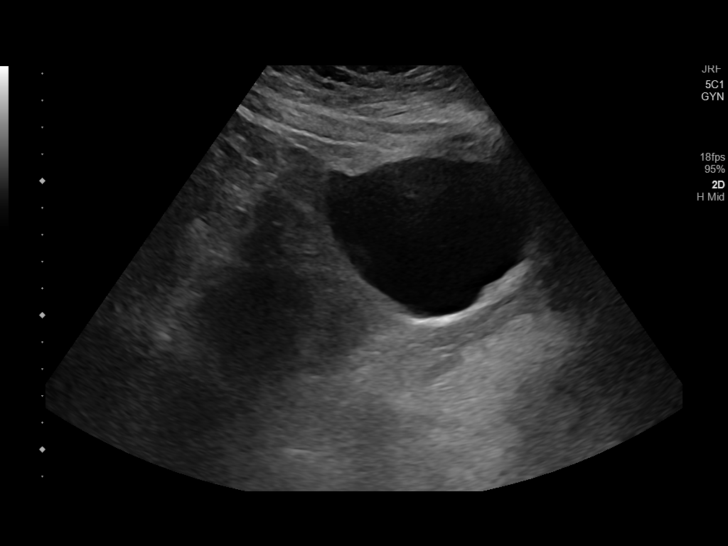
[im 6/33]
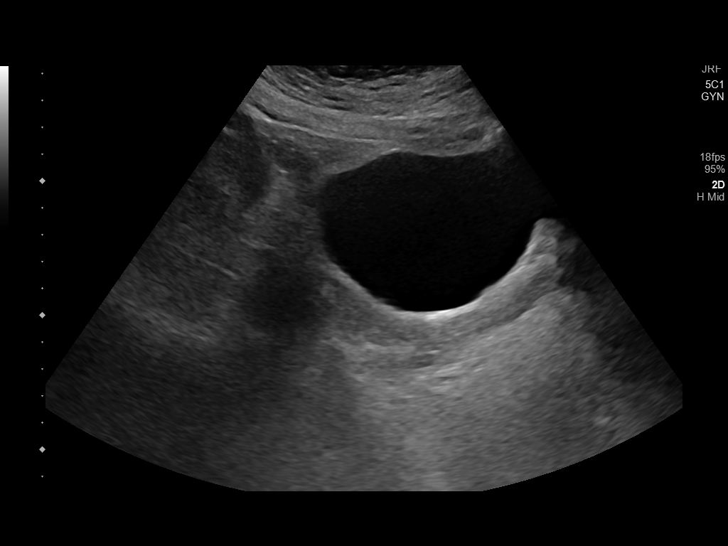
[im 9/33]
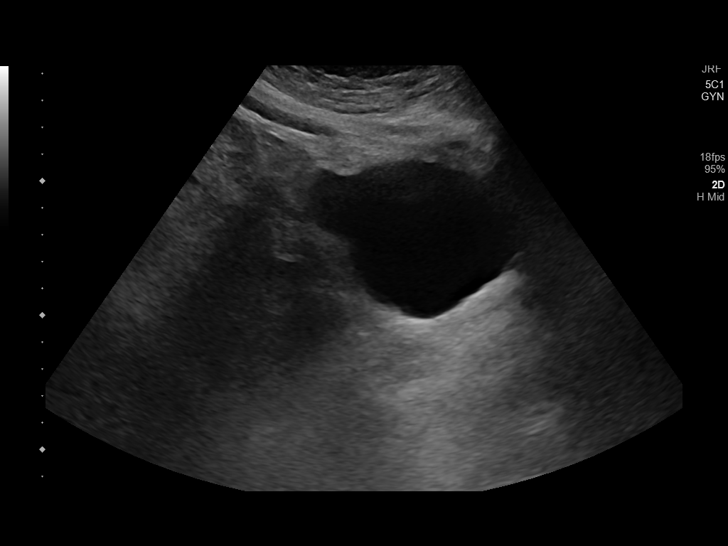
[im 11/33]
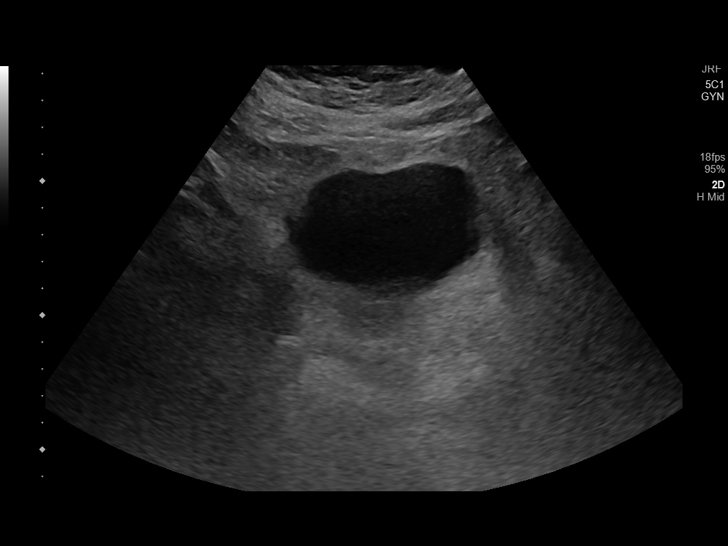
[im 13/33]
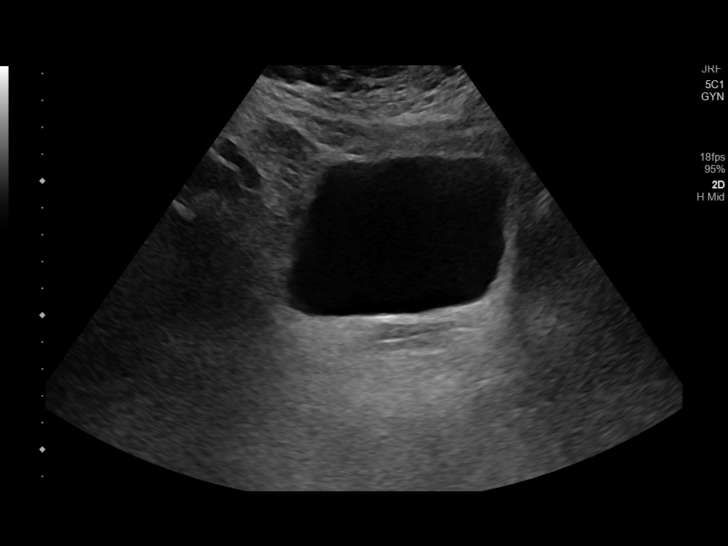
[im 15/33]
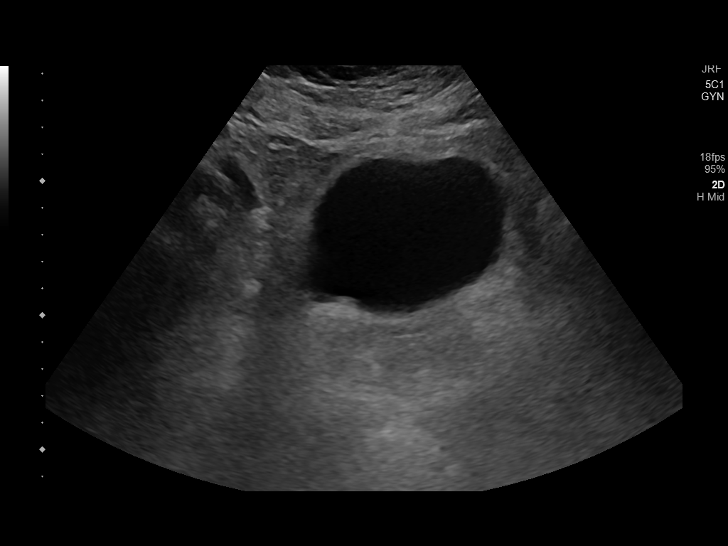
[im 18/33]
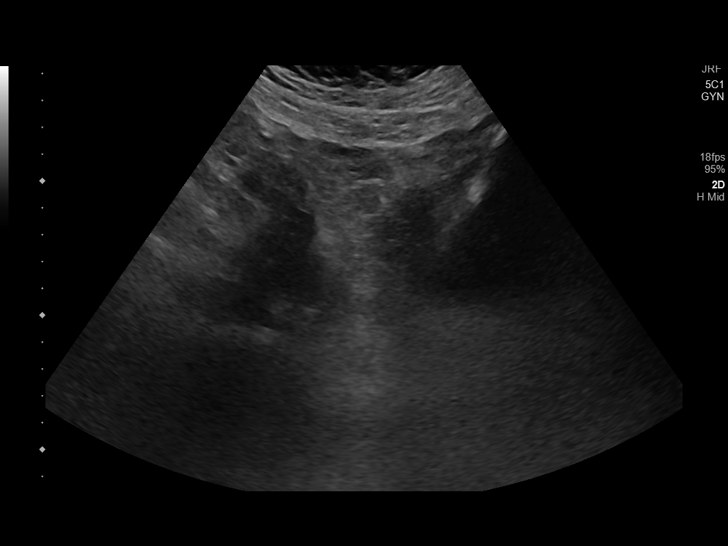
[im 21/33]
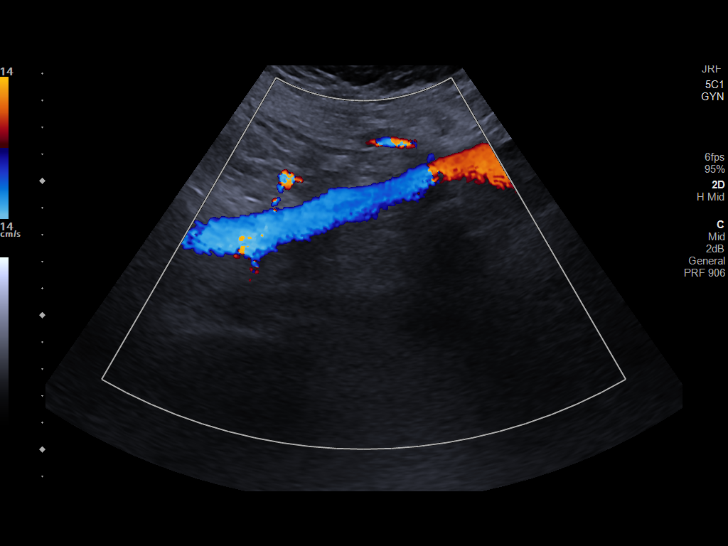
[im 22/33]
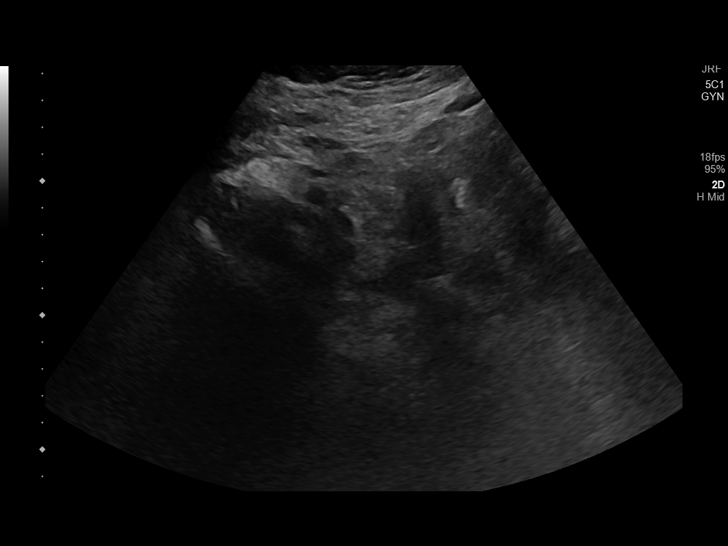
[im 25/33]
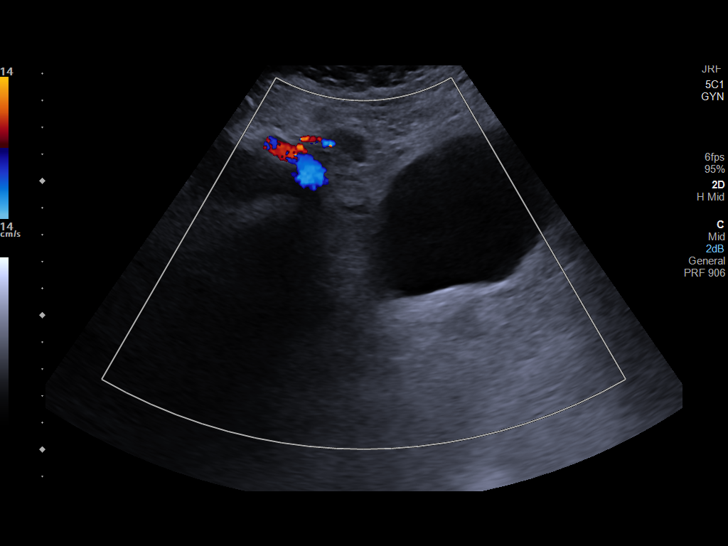
[im 27/33]
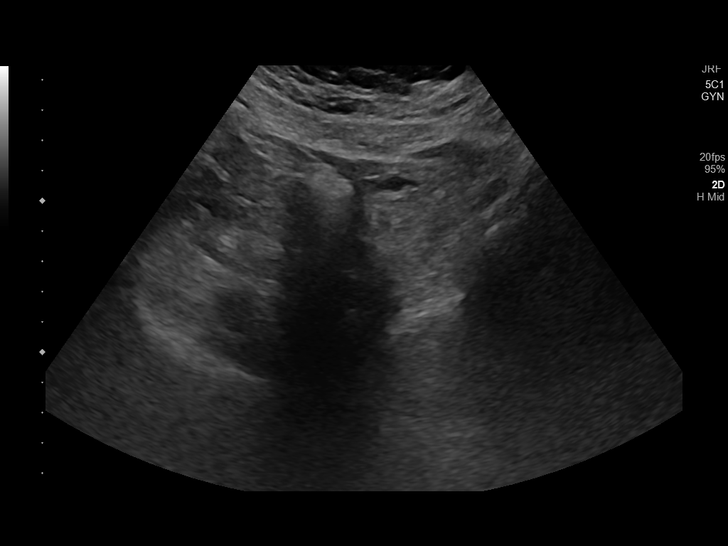
[im 30/33]
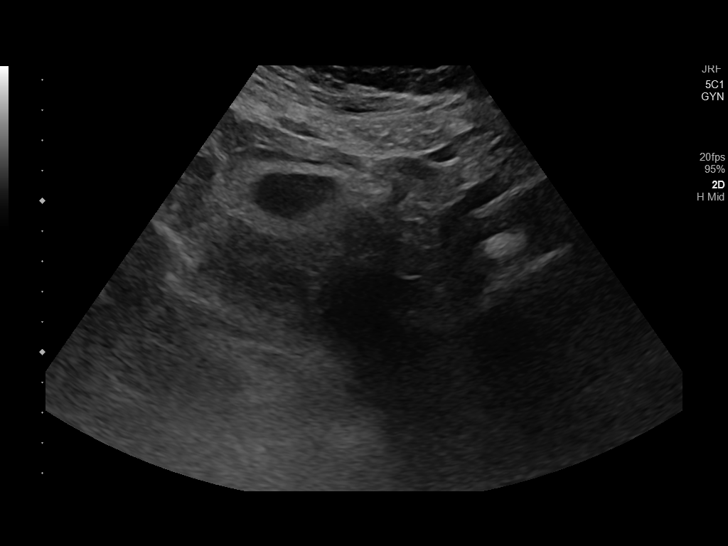
[im 33/33]
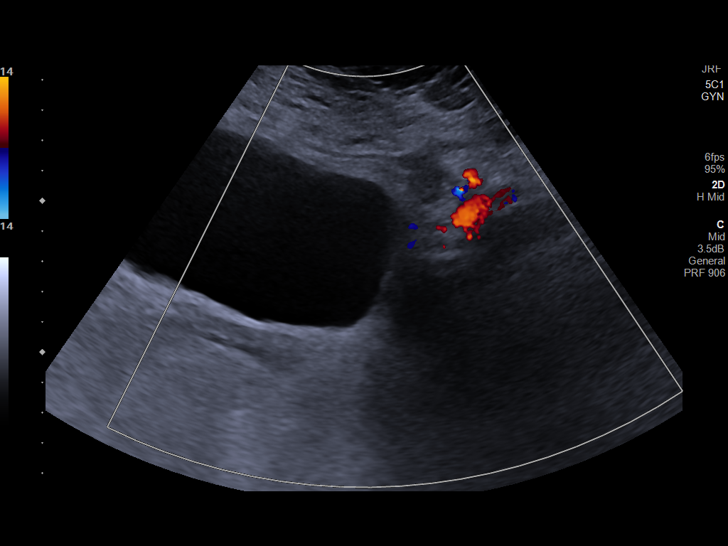

[14 of 25 positions shown; findings below may reference images not displayed]

FINDINGS: Uterus

Surgically absent.

Right ovary

Not visualized.  No adnexal masses.

Left ovarynot visualized.  No adnexal masses.

Other findings: Normal appearance of the bladder. No pelvic masses
or abnormal fluid collections.
IMPRESSION: 1. No acute findings.  No findings to account for vaginal bleeding.
2. Status post hysterectomy.  Neither ovary visualized.

## 2023-10-03 ENCOUNTER — Ambulatory Visit
Admission: EM | Admit: 2023-10-03 | Discharge: 2023-10-03 | Disposition: A | Discharge status: Home / Self Care | Attending: Physician Assistant | Admitting: Physician Assistant

## 2023-10-03 ENCOUNTER — Encounter: Payer: Self-pay | Admitting: Emergency Medicine

## 2023-10-03 DIAGNOSIS — J029 Acute pharyngitis, unspecified: Secondary | ICD-10-CM

## 2023-10-03 DIAGNOSIS — J069 Acute upper respiratory infection, unspecified: Secondary | ICD-10-CM | POA: Insufficient documentation

## 2023-10-03 DIAGNOSIS — R0981 Nasal congestion: Secondary | ICD-10-CM | POA: Diagnosis present

## 2023-10-03 DIAGNOSIS — R051 Acute cough: Secondary | ICD-10-CM | POA: Diagnosis present

## 2023-10-03 LAB — GROUP A STREP BY PCR: Group A Strep by PCR: NOT DETECTED

## 2023-10-03 LAB — RESP PANEL BY RT-PCR (FLU A&B, COVID) ARPGX2
Influenza A by PCR: NEGATIVE
Influenza B by PCR: NEGATIVE
SARS Coronavirus 2 by RT PCR: NEGATIVE

## 2023-10-03 MED ORDER — LIDOCAINE VISCOUS HCL 2 % MT SOLN: 15.0000 mL | OROMUCOSAL | 0 refills | Status: AC | PRN

## 2023-10-03 MED ORDER — PSEUDOEPH-BROMPHEN-DM 30-2-10 MG/5ML PO SYRP: 10.0000 mL | ORAL_SOLUTION | Freq: Four times a day (QID) | ORAL | 0 refills | Status: AC | PRN

## 2023-10-03 NOTE — ED Triage Notes (Signed)
 Pt c/o sore throat, nasal congestion, cough. Started yesterday. She states her son was just diagnosed with URI and strep throat, and she feels like she is getting what he had. She will need a doctors note.

## 2023-10-03 NOTE — ED Provider Notes (Signed)
 [facility]    CSN: [id number] Arrival date & time: 10/03/23  1041      History   Chief Complaint Chief Complaint  Patient presents with   Sore Throat    HPI Carolyn Ward is a 43 y.o. female presenting for sore throat, cough, and nasal congestion that began yesterday. Denies fever, chest pain, shortness of breath, vomiting or diarrhea. Has been around her son who has had similar symptoms. Patient says he was diagnosed with a URI and strep throat. She has been taking OTC meds. Denies other concerns.  HPI  Past Medical History:  Diagnosis Date   Anxiety    Cyst of right ovary    Hypertension    Seasonal allergies     Patient Active Problem List   Diagnosis Date Noted   Menorrhagia with regular cycle 08/16/2021   Pelvic pain in female 08/16/2021    Past Surgical History:  Procedure Laterality Date   CYSTOSCOPY N/A 08/16/2021   Procedure: CYSTOSCOPY;  Surgeon: Kris Pester, MD;  Location: [facility];  Service: Gynecology;  Laterality: N/A;   NO PAST SURGERIES     ROBOTIC ASSISTED LAPAROSCOPIC HYSTERECTOMY AND SALPINGECTOMY Bilateral 08/16/2021   Procedure: XI ROBOTIC ASSISTED LAPAROSCOPIC HYSTERECTOMY AND SALPINGECTOMY;  Surgeon: Kris Pester, MD;  Location: [facility];  Service: Gynecology;  Laterality: Bilateral;    OB History     Gravida  3   Para  1   Term      Preterm      AB      Living         SAB      IAB      Ectopic      Multiple      Live Births               Home Medications    Prior to Admission medications   Medication Sig Start Date End Date Taking? Authorizing Provider  brompheniramine-pseudoephedrine-DM 30-2-10 MG/5ML syrup Take 10 mLs by mouth 4 (four) times daily as needed for up to 7 days. 10/03/23 10/10/23 Yes Floydene Hy, PA-C  cetirizine (ZYRTEC) 10 MG tablet Take 10 mg by mouth daily.   Yes [provider]  lidocaine  (XYLOCAINE ) 2 % solution Use as directed 15 mLs in the mouth or throat  every 3 (three) hours as needed for mouth pain (swish and spit). 10/03/23  Yes Nancy Axon B, PA-C  losartan (COZAAR) 50 MG tablet Take 50 mg by mouth daily. 07/18/21  Yes [provider]  sertraline (ZOLOFT) 50 MG tablet Take 50 mg by mouth daily.   Yes [provider]  albuterol  (VENTOLIN  HFA) 108 (90 Base) MCG/ACT inhaler Inhale 1-2 puffs into the lungs every 6 (six) hours as needed for wheezing or shortness of breath. Patient not taking: Reported on 08/03/2021 05/07/20   Coralyn Derry, PA-C  ibuprofen  (ADVIL ) 600 MG tablet Take 1 tablet (600 mg total) by mouth every 6 (six) hours. 08/16/21   Kris Pester, MD  WEGOVY 1.7 MG/0.75ML SOAJ SMARTSIG:1.7 Milligram(s) SUB-Q Once a Week    [provider]    Family History Family History  Problem Relation Age of Onset   Hypertension Mother    Other Father        unknown medical history    Social History Social History   Tobacco Use   Smoking status: Never   Smokeless tobacco: Never  Vaping Use   Vaping status: Never Used  Substance Use Topics   Alcohol use: Yes    Comment: socially   Drug use: Never     Allergies   Patient has no known allergies.   Review of Systems Review of Systems  Constitutional:  Negative for chills, diaphoresis, fatigue and fever.  HENT:  Positive for congestion, rhinorrhea and sore throat. Negative for ear pain, sinus pressure and sinus pain.   Respiratory:  Positive for cough. Negative for shortness of breath.   Cardiovascular:  Negative for chest pain.  Gastrointestinal:  Negative for abdominal pain, nausea and vomiting.  Musculoskeletal:  Negative for arthralgias and myalgias.  Skin:  Negative for rash.  Neurological:  Negative for weakness and headaches.  Hematological:  Negative for adenopathy.     Physical Exam Triage Vital Signs ED Triage Vitals  Encounter Vitals Group     BP      Girls Systolic BP Percentile      Girls Diastolic BP Percentile      Boys  Systolic BP Percentile      Boys Diastolic BP Percentile      Pulse      Resp      Temp      Temp src      SpO2      Weight      Height      Head Circumference      Peak Flow      Pain Score      Pain Loc      Pain Education      Exclude from Growth Chart    No data found.  Updated Vital Signs BP 103/70 (BP Location: Right Arm)   Pulse 80   Temp 98.4 F (36.9 C) (Oral)   Resp 16   Ht 5' 8 (1.727 m)   Wt 199 lb 15.3 oz (90.7 kg)   LMP 07/18/2021   SpO2 95%   BMI 30.40 kg/m   Physical Exam Vitals and nursing note reviewed.  Constitutional:      General: She is not in acute distress.    Appearance: Normal appearance. She is not ill-appearing or toxic-appearing.  HENT:     Head: Normocephalic and atraumatic.     Nose: Congestion present.     Mouth/Throat:     Mouth: Mucous membranes are moist.     Pharynx: Oropharynx is clear. Posterior oropharyngeal erythema present.   Eyes:     General: No scleral icterus.       Right eye: No discharge.        Left eye: No discharge.     Conjunctiva/sclera: Conjunctivae normal.    Cardiovascular:     Rate and Rhythm: Normal rate and regular rhythm.     Heart sounds: Normal heart sounds.  Pulmonary:     Effort: Pulmonary effort is normal. No respiratory distress.     Breath sounds: Normal breath sounds.   Musculoskeletal:     Cervical back: Neck supple.   Skin:    General: Skin is dry.   Neurological:     General: No focal deficit present.     Mental Status: She is alert. Mental status is at baseline.     Motor: No weakness.     Gait: Gait normal.   Psychiatric:        Mood and Affect: Mood normal.        Behavior: Behavior normal.      UC Treatments / Results  Labs (all labs ordered are listed, but only  abnormal results are displayed) Labs Reviewed  GROUP A STREP BY PCR  RESP PANEL BY RT-PCR (FLU A&B, COVID) [id number]    EKG   Radiology No results found.  Procedures Procedures (including critical  care time)  Medications Ordered in UC Medications - No data to display  Initial Impression / Assessment and Plan / UC Course  I have reviewed the triage vital signs and the nursing notes.  Pertinent labs & imaging results that were available during my care of the patient were reviewed by me and considered in my medical decision making (see chart for details).   43 year old female presents for cough, congestion and sore throat for the past day.  No fever, chest pain or shortness of breath.  Vitals stable and normal.  Overall well-appearing.  On exam has nasal congestion and erythema posterior veins.  Chest clear.  Heart regular rate and rhythm.  PCR and resp panel obtained. Will contact you with any positive results.   Viral illness. Sent Bromfed DM and viscous lidocaine  to pharmacy for symptoms. Reviewed return precautions.   Negative strep, COVID and flu testing.    Final Clinical Impressions(s) / UC Diagnoses   Final diagnoses:  Viral upper respiratory tract infection  Sore throat  Acute cough  Nasal congestion     Discharge Instructions      Pending flu, COVID and strep testing.  Will contact you with any positive results. - Sent cough medicine and viscous lidocaine . - You need to isolate until you are fever free for 24 hours and symptoms are improving. - Increase rest and fluids. - You should be seen again if you have uncontrolled fever, weakness or worsening breathing problem.      ED Prescriptions     Medication Sig Dispense Auth. Provider   brompheniramine-pseudoephedrine-DM 30-2-10 MG/5ML syrup Take 10 mLs by mouth 4 (four) times daily as needed for up to 7 days. 150 mL Nancy Axon B, PA-C   lidocaine  (XYLOCAINE ) 2 % solution Use as directed 15 mLs in the mouth or throat every 3 (three) hours as needed for mouth pain (swish and spit). 100 mL Floydene Hy, PA-C      PDMP not reviewed this encounter.   Floydene Hy, PA-C 10/03/23 1206

## 2023-10-03 NOTE — Discharge Instructions (Addendum)
 Pending flu, COVID and strep testing.  Will contact you with any positive results. - Sent cough medicine and viscous lidocaine . - You need to isolate until you are fever free for 24 hours and symptoms are improving. - Increase rest and fluids. - You should be seen again if you have uncontrolled fever, weakness or worsening breathing problem.

## 2023-12-16 ENCOUNTER — Ambulatory Visit
Admission: EM | Admit: 2023-12-16 | Discharge: 2023-12-16 | Disposition: A | Discharge status: Home / Self Care | Attending: Emergency Medicine | Admitting: Emergency Medicine

## 2023-12-16 ENCOUNTER — Encounter: Payer: Self-pay | Admitting: Emergency Medicine

## 2023-12-16 DIAGNOSIS — M79672 Pain in left foot: Secondary | ICD-10-CM

## 2023-12-16 MED ORDER — PREDNISONE 10 MG (21) PO TBPK: ORAL_TABLET | Freq: Every day | ORAL | 0 refills | Status: AC

## 2023-12-16 NOTE — ED Triage Notes (Signed)
 Pt has fire ant bites to her left foot x 2 days.

## 2023-12-16 NOTE — ED Provider Notes (Signed)
 [facility]    CSN: [id number] Arrival date & time: 12/16/23  0830      History   Chief Complaint Chief Complaint  Patient presents with   Insect Bite    HPI Carolyn Ward is a 43 y.o. female.   Patient presents for evaluation of pain and swelling to the left foot beginning 2 days ago after being bitten by fire ants, insect witnessed.  Endorses the skin feels bruised to touch.  Associated pruritus.  Has attempted use of ice, Benadryl and cortisone cream without relief.  Denies drainage or fever.   Past Medical History:  Diagnosis Date   Anxiety    Cyst of right ovary    Hypertension    Seasonal allergies     Patient Active Problem List   Diagnosis Date Noted   Menorrhagia with regular cycle 08/16/2021   Pelvic pain in female 08/16/2021    Past Surgical History:  Procedure Laterality Date   CYSTOSCOPY N/A 08/16/2021   Procedure: CYSTOSCOPY;  Surgeon: Leonce Garnette BIRCH, MD;  Location: [facility];  Service: Gynecology;  Laterality: N/A;   NO PAST SURGERIES     ROBOTIC ASSISTED LAPAROSCOPIC HYSTERECTOMY AND SALPINGECTOMY Bilateral 08/16/2021   Procedure: XI ROBOTIC ASSISTED LAPAROSCOPIC HYSTERECTOMY AND SALPINGECTOMY;  Surgeon: Leonce Garnette BIRCH, MD;  Location: [facility];  Service: Gynecology;  Laterality: Bilateral;    OB History     Gravida  3   Para  1   Term      Preterm      AB      Living         SAB      IAB      Ectopic      Multiple      Live Births               Home Medications    Prior to Admission medications   Medication Sig Start Date End Date Taking? Authorizing Provider  losartan (COZAAR) 50 MG tablet Take 50 mg by mouth daily. 07/18/21  Yes [provider]  sertraline (ZOLOFT) 50 MG tablet Take 50 mg by mouth daily.   Yes [provider]  albuterol  (VENTOLIN  HFA) 108 (90 Base) MCG/ACT inhaler Inhale 1-2 puffs into the lungs every 6 (six) hours as needed for wheezing or shortness of breath. Patient  not taking: Reported on 08/03/2021 05/07/20   Charlene Debby BROCKS, PA-C  cetirizine (ZYRTEC) 10 MG tablet Take 10 mg by mouth daily.    [provider]  ibuprofen  (ADVIL ) 600 MG tablet Take 1 tablet (600 mg total) by mouth every 6 (six) hours. 08/16/21   Leonce Garnette BIRCH, MD  lidocaine  (XYLOCAINE ) 2 % solution Use as directed 15 mLs in the mouth or throat every 3 (three) hours as needed for mouth pain (swish and spit). 10/03/23   Arvis Jolan NOVAK, PA-C  WEGOVY 1.7 MG/0.75ML SOAJ SMARTSIG:1.7 Milligram(s) SUB-Q Once a Week    [provider]    Family History Family History  Problem Relation Age of Onset   Hypertension Mother    Other Father        unknown medical history    Social History Social History   Tobacco Use   Smoking status: Never   Smokeless tobacco: Never  Vaping Use   Vaping status: Never Used  Substance Use Topics   Alcohol use: Yes    Comment: socially   Drug use: Never     Allergies   Patient  has no known allergies.   Review of Systems Review of Systems   Physical Exam Triage Vital Signs ED Triage Vitals  Encounter Vitals Group     BP 12/16/23 0845 118/80     Girls Systolic BP Percentile --      Girls Diastolic BP Percentile --      Boys Systolic BP Percentile --      Boys Diastolic BP Percentile --      Pulse Rate 12/16/23 0845 67     Resp 12/16/23 0845 16     Temp 12/16/23 0845 98.6 F (37 C)     Temp Source 12/16/23 0845 Oral     SpO2 12/16/23 0845 97 %     Weight 12/16/23 0845 181 lb (82.1 kg)     Height --      Head Circumference --      Peak Flow --      Pain Score 12/16/23 0846 3     Pain Loc --      Pain Education --      Exclude from Growth Chart --    No data found.  Updated Vital Signs BP 118/80 (BP Location: Left Arm)   Pulse 67   Temp 98.6 F (37 C) (Oral)   Resp 16   Wt 181 lb (82.1 kg)   LMP 07/18/2021   SpO2 97%   BMI 27.52 kg/m   Visual Acuity Right Eye Distance:   Left Eye Distance:   Bilateral  Distance:    Right Eye Near:   Left Eye Near:    Bilateral Near:     Physical Exam Constitutional:      Appearance: Normal appearance.  Eyes:     Extraocular Movements: Extraocular movements intact.  Pulmonary:     Effort: Pulmonary effort is normal.  Skin:    Comments: Scattered papules generalized to the left foot, generalized swelling and generalized tenderness, no drainage, 2+ pedal pulse, able to bear weight and complete full range of motion  Neurological:     Mental Status: She is alert and oriented to person, place, and time. Mental status is at baseline.      UC Treatments / Results  Labs (all labs ordered are listed, but only abnormal results are displayed) Labs Reviewed - No data to display  EKG   Radiology No results found.  Procedures Procedures (including critical care time)  Medications Ordered in UC Medications - No data to display  Initial Impression / Assessment and Plan / UC Course  I have reviewed the triage vital signs and the nursing notes.  Pertinent labs & imaging results that were available during my care of the patient were reviewed by me and considered in my medical decision making (see chart for details).  Left foot pain  Etiology most likely localized reaction to insect bites, discussed with patient, at this time no signs of infection, declined IM steroid injection and prescribed oral prednisone  and discussed administration, recommended over-the-counter medications and nonpharmacological measures for management of pruritus and advised follow-up with urgent care if symptoms persist or worsen or recur, advised to follow-up for any signs of infection Final Clinical Impressions(s) / UC Diagnoses   Final diagnoses:  None   Discharge Instructions   None    ED Prescriptions   None    PDMP not reviewed this encounter.   Teresa Shelba SAUNDERS, [place] 12/16/23 [phone]

## 2023-12-16 NOTE — Discharge Instructions (Signed)
 Today you were evaluated for the pain and swelling to your left foot which is most likely a localized reaction to the fire ants  Begin prednisone  every morning with food as directed to reduce inflammation and help reduce pain, avoid use of ibuprofen  but you may use Tylenol   May use ice or heat over the affected area and 10 to 15-minute intervals  May elevate whenever sitting and lying to help reduce swelling  You may continue use of topical products such as cortisone for additional comfort  For itching you may use allergy medicine such as Claritin  Zyrtec or Benadryl, may use topical Benadryl or calamine lotion as an alternative to oral medicine  If your symptoms continue to persist or worsen please follow-up for reevaluation

## 2024-04-02 ENCOUNTER — Other Ambulatory Visit: Payer: Self-pay | Admitting: Physician Assistant

## 2024-04-02 DIAGNOSIS — Z1231 Encounter for screening mammogram for malignant neoplasm of breast: Secondary | ICD-10-CM
